# Patient Record
Sex: Male | Born: 1950 | Race: White | Hispanic: No | Marital: Married | Smoking: Never smoker
Health system: Southern US, Community
[De-identification: ages and names within clinical notes are randomized; demographics above are authoritative.]

## PROBLEM LIST (undated history)

## (undated) DIAGNOSIS — N189 Chronic kidney disease, unspecified: Secondary | ICD-10-CM

## (undated) DIAGNOSIS — I350 Nonrheumatic aortic (valve) stenosis: Secondary | ICD-10-CM

## (undated) DIAGNOSIS — IMO0001 Reserved for inherently not codable concepts without codable children: Secondary | ICD-10-CM

## (undated) DIAGNOSIS — I77810 Thoracic aortic ectasia: Secondary | ICD-10-CM

## (undated) DIAGNOSIS — Z8249 Family history of ischemic heart disease and other diseases of the circulatory system: Secondary | ICD-10-CM

## (undated) DIAGNOSIS — I10 Essential (primary) hypertension: Secondary | ICD-10-CM

## (undated) DIAGNOSIS — E785 Hyperlipidemia, unspecified: Secondary | ICD-10-CM

## (undated) DIAGNOSIS — I6521 Occlusion and stenosis of right carotid artery: Secondary | ICD-10-CM

## (undated) DIAGNOSIS — I129 Hypertensive chronic kidney disease with stage 1 through stage 4 chronic kidney disease, or unspecified chronic kidney disease: Secondary | ICD-10-CM

## (undated) DIAGNOSIS — E1122 Type 2 diabetes mellitus with diabetic chronic kidney disease: Secondary | ICD-10-CM

## (undated) HISTORY — DX: Chronic kidney disease, unspecified: N18.9

## (undated) HISTORY — DX: Nonrheumatic aortic (valve) stenosis: I35.0

## (undated) HISTORY — DX: Hypertensive chronic kidney disease with stage 1 through stage 4 chronic kidney disease, or unspecified chronic kidney disease: I12.9

## (undated) HISTORY — DX: Type 2 diabetes mellitus with diabetic chronic kidney disease: E11.22

## (undated) HISTORY — DX: Thoracic aortic ectasia: I77.810

## (undated) HISTORY — DX: Reserved for inherently not codable concepts without codable children: IMO0001

## (undated) HISTORY — DX: Occlusion and stenosis of right carotid artery: I65.21

## (undated) HISTORY — DX: Essential (primary) hypertension: I10

## (undated) HISTORY — DX: Hyperlipidemia, unspecified: E78.5

## (undated) HISTORY — DX: Family history of ischemic heart disease and other diseases of the circulatory system: Z82.49

---

## 2005-10-22 IMAGING — CT AWWO/PW
3 of 7 series · 13 of 42 positions shown, 19 images · IV contrast (CONTRAST)
Comparison: none

[Series 3: arterial · axial · arterial · 0.91mm/px · z∈[+1230,+1380]mm · 3 of 60 slices shown]
[im 15/60  soft-tissue]
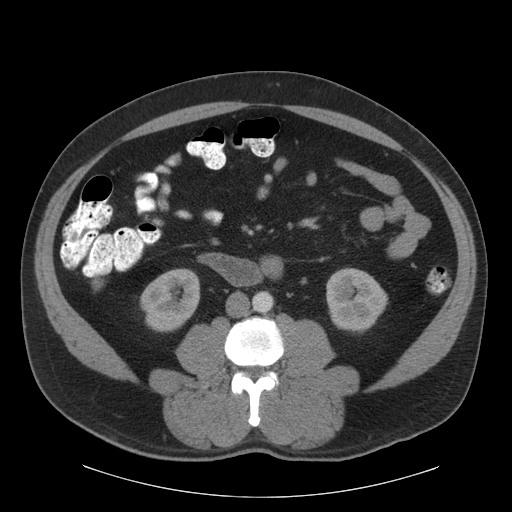
[im 30/60  soft-tissue]
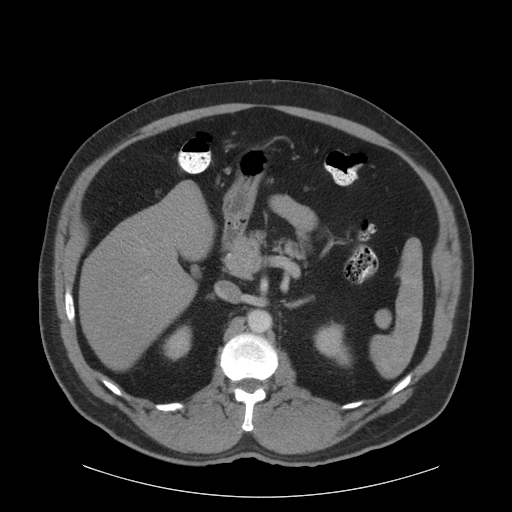
[im 45/60  soft-tissue]
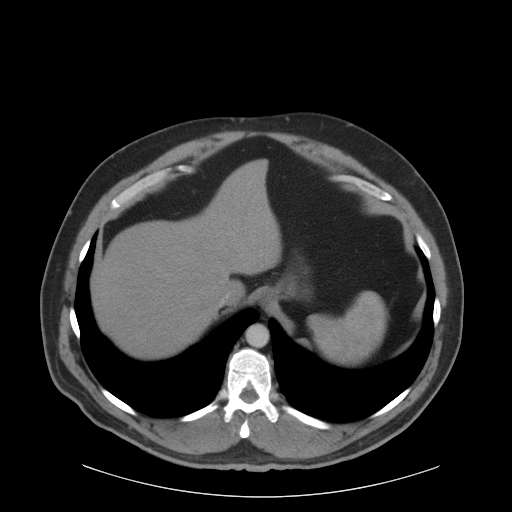

[Series 4: venous · axial · portal-venous · 0.91mm/px · z∈[+946,+1381]mm · 7 of 117 slices shown, 12 images]
[im 15/117  soft-tissue]
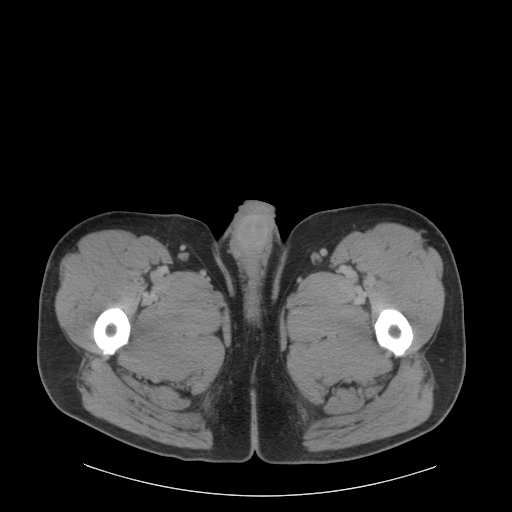
[im 15/117  bone]
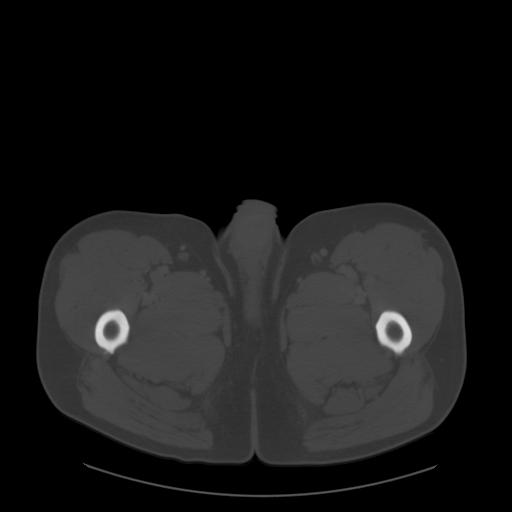
[im 30/117  soft-tissue]
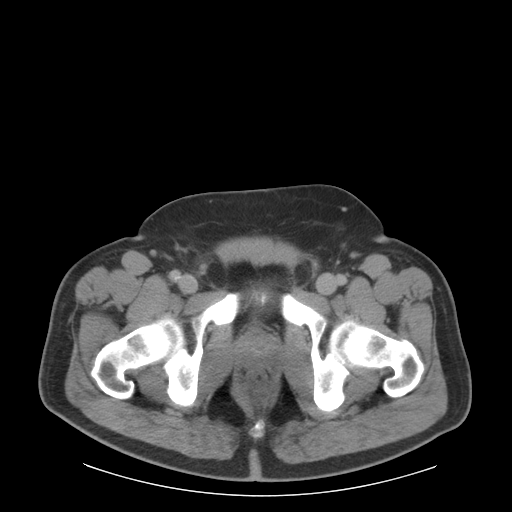
[im 44/117  soft-tissue]
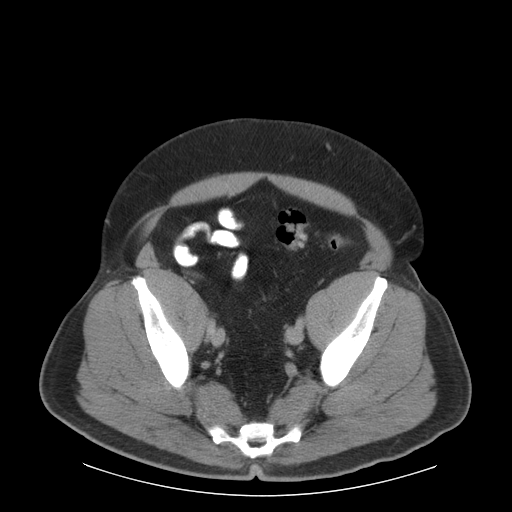
[im 59/117  soft-tissue]
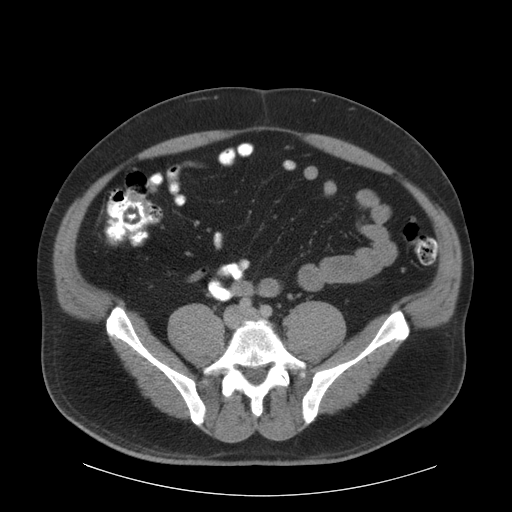
[im 59/117  lung]
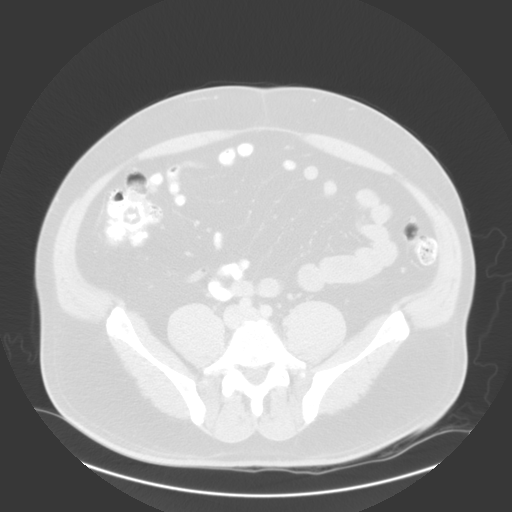
[im 73/117  soft-tissue]
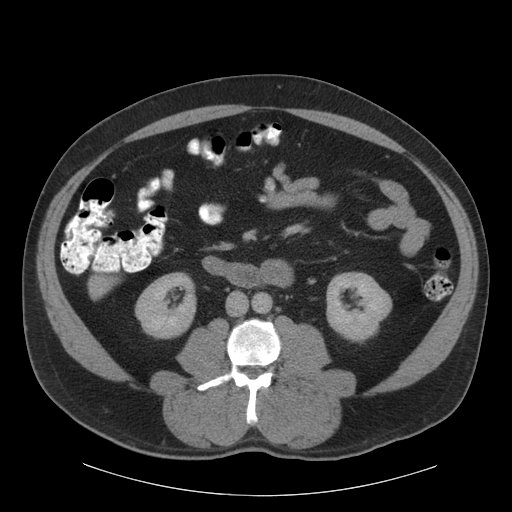
[im 73/117  lung]
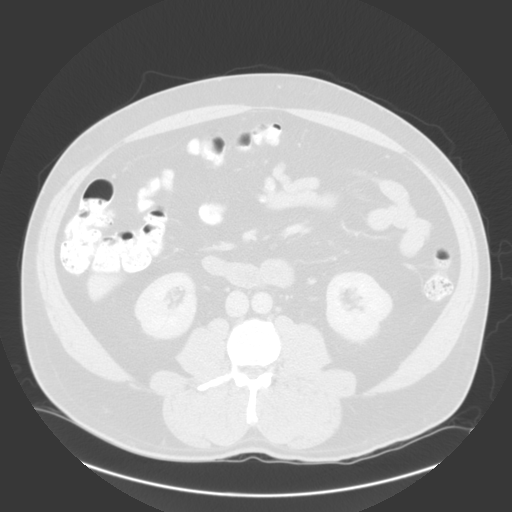
[im 88/117  soft-tissue]
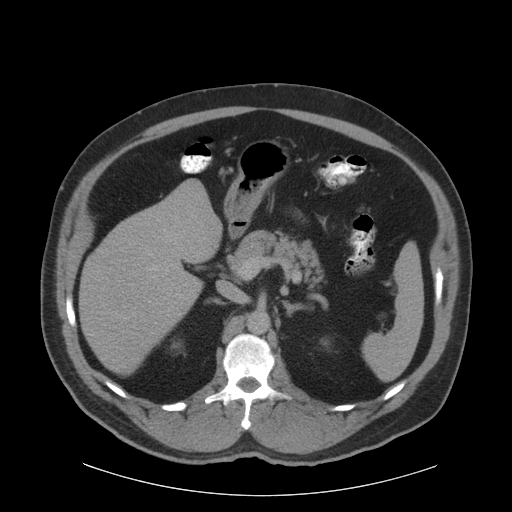
[im 88/117  lung]
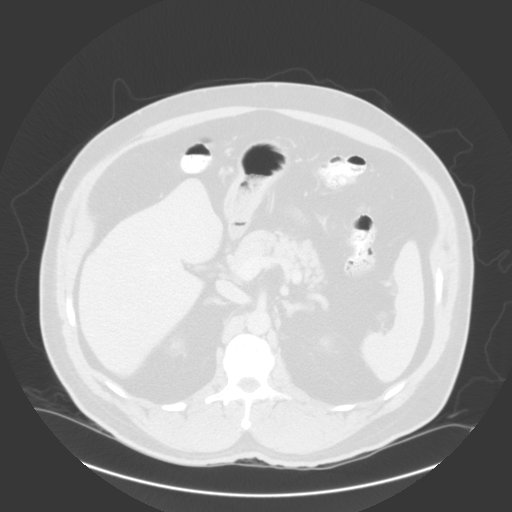
[im 102/117  soft-tissue]
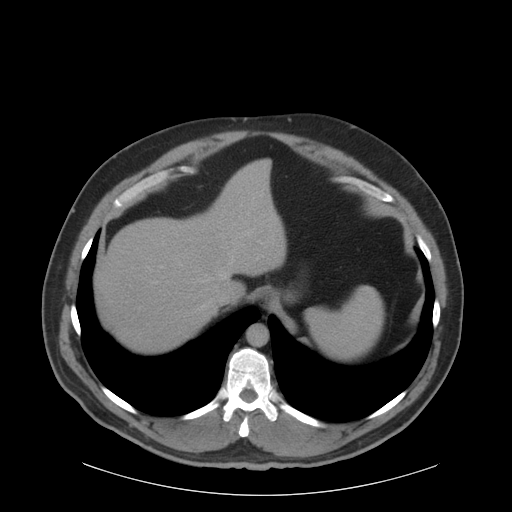
[im 102/117  lung]
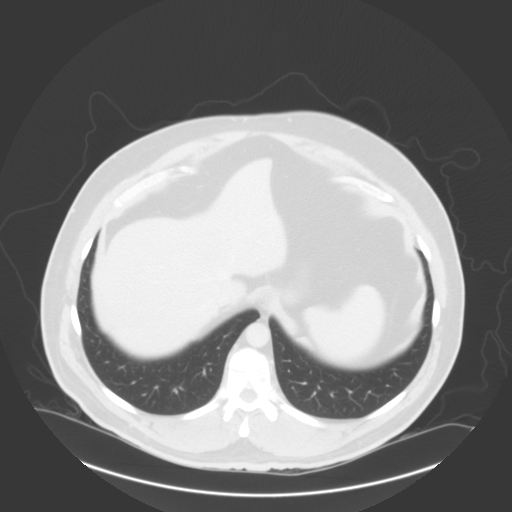

[coronal · coronal · 1.13mm/px · 3 of 114 slices shown, 4 images]
[im 38/114  soft-tissue]
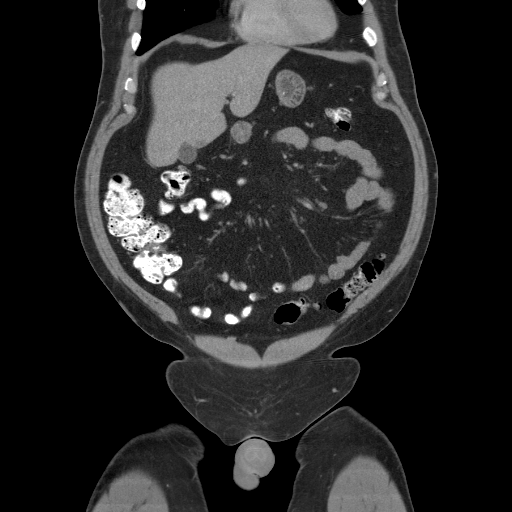
[im 51/114  soft-tissue]
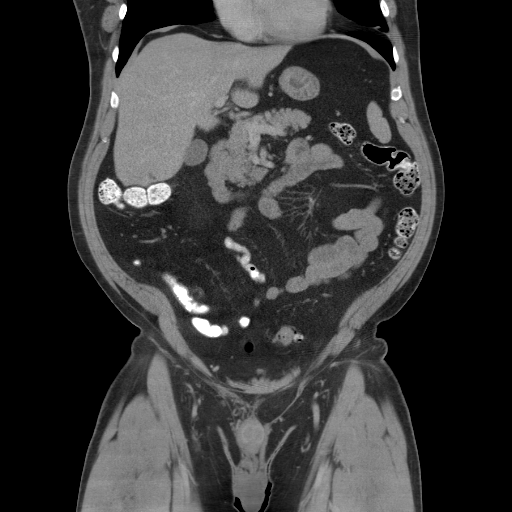
[im 51/114  bone]
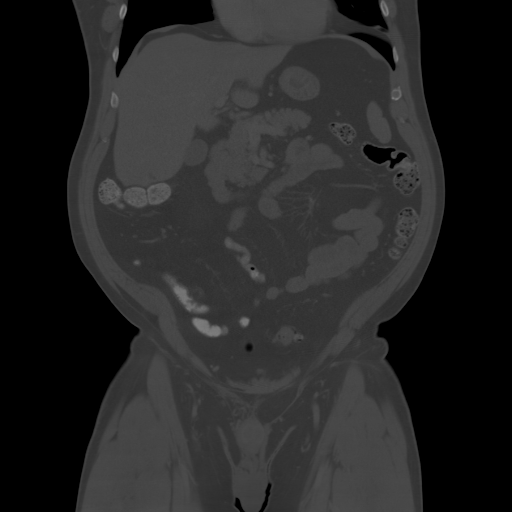
[im 63/114  soft-tissue]
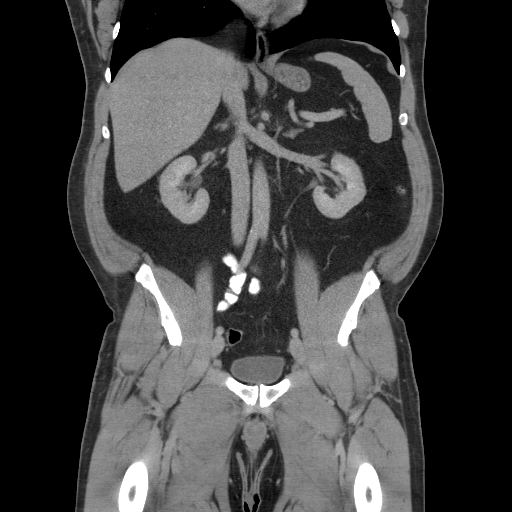

[13 of 42 positions shown; findings below may reference images not displayed]

______________________________________________________________
JIM [DATE] JIM [DATE]

REASON FOR CONSULTATION: Last Menstrual Date:..... 2 WKS AGO Reason
for Consultation:. R/O FRACTURE Comment to Radiology:.... RM GYN 1 LEFT
GREAT TOE
____________________________________________

EXAM: TOES

Three views with a repeat three views for technique shows no fracture or
dislocation.

 Patient JIM [DATE]

## 2011-12-21 ENCOUNTER — Ambulatory Visit (INDEPENDENT_AMBULATORY_CARE_PROVIDER_SITE_OTHER): Payer: BC Managed Care – PPO | Admitting: Family Medicine

## 2011-12-21 VITALS — BP 165/90 | HR 65 | Temp 98.2°F | Resp 16 | Ht 71.0 in | Wt 251.0 lb

## 2011-12-21 DIAGNOSIS — J029 Acute pharyngitis, unspecified: Secondary | ICD-10-CM

## 2011-12-21 LAB — POCT RAPID STREP A (OFFICE): Rapid Strep A Screen: NEGATIVE

## 2011-12-21 MED ORDER — AZITHROMYCIN 250 MG PO TABS: ORAL_TABLET | ORAL | Status: AC

## 2011-12-21 NOTE — Progress Notes (Signed)
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  Noted exudates with green phlegm when clearing throat today.  O:  NAD  130/80 Oroph:  Red, no exudates Neck: mild anterior cervical node enlgment bilaterally TM: normal Chest:  Clear Heart:  Reg, no murmur  A:

## 2011-12-21 NOTE — Patient Instructions (Addendum)
Blood pressure:  130/80   Sore Throat Sore throats may be caused by bacteria and viruses. They may also be caused by:  Smoking.   Pollution.   Allergies.  If a sore throat is due to strep infection (a bacterial infection), you may need:  A throat swab.   A culture test to verify the strep infection.  You will need one of these:  An antibiotic shot.   Oral medicine for a full 10 days.  Strep infection is very contagious. A doctor should check any close contacts who have a sore throat or fever. A sore throat caused by a virus infection will usually last only 3-4 days. Antibiotics will not treat a viral sore throat.  Infectious mononucleosis (a viral disease), however, can cause a sore throat that lasts for up to 3 weeks. Mononucleosis can be diagnosed with blood tests. You must have been sick for at least 1 week in order for the test to give accurate results. HOME CARE INSTRUCTIONS   To treat a sore throat, take mild pain medicine.   Increase your fluids.   Eat a soft diet.   Do not smoke.   Gargling with warm water or salt water (1 tsp. salt in 8 oz. water) can be helpful.   Try throat sprays or lozenges or sucking on hard candy to ease the symptoms.  Call your doctor if your sore throat lasts longer than 1 week.  SEEK IMMEDIATE MEDICAL CARE IF:  You have difficulty breathing.   You have increased swelling in the throat.   You have pain so severe that you are unable to swallow fluids or your saliva.   You have a severe headache, a high fever, vomiting, or a red rash.  Document Released: 09/16/2004 Document Revised: 07/29/2011 Document Reviewed: 07/27/2007 [facility] Patient Information 2012 [place], [place].

## 2012-08-05 ENCOUNTER — Ambulatory Visit (INDEPENDENT_AMBULATORY_CARE_PROVIDER_SITE_OTHER): Payer: BC Managed Care – PPO | Admitting: Emergency Medicine

## 2012-08-05 VITALS — BP 150/84 | HR 87 | Temp 98.6°F | Resp 18 | Wt 259.0 lb

## 2012-08-05 DIAGNOSIS — H8110 Benign paroxysmal vertigo, unspecified ear: Secondary | ICD-10-CM

## 2012-08-05 MED ORDER — MECLIZINE HCL 50 MG PO TABS: 50.0000 mg | ORAL_TABLET | Freq: Three times a day (TID) | ORAL | Status: DC | PRN

## 2012-08-05 NOTE — Patient Instructions (Addendum)
Benign Positional Vertigo  Vertigo means you feel like you or your surroundings are moving when they are not. Benign positional vertigo is the most common form of vertigo. Benign means that the cause of your condition is not serious. Benign positional vertigo is more common in older adults.  CAUSES   Benign positional vertigo is the result of an upset in the labyrinth system. This is an area in the middle ear that helps control your balance. This may be caused by a viral infection, head injury, or repetitive motion. However, often no specific cause is found.  SYMPTOMS   Symptoms of benign positional vertigo occur when you move your head or eyes in different directions. Some of the symptoms may include:  · Loss of balance and falls.  · Vomiting.  · Blurred vision.  · Dizziness.  · Nausea.  · Involuntary eye movements (nystagmus).  DIAGNOSIS   Benign positional vertigo is usually diagnosed by physical exam. If the specific cause of your benign positional vertigo is unknown, your caregiver may perform imaging tests, such as magnetic resonance imaging (MRI) or computed tomography (CT).  TREATMENT   Your caregiver may recommend movements or procedures to correct the benign positional vertigo. Medicines such as meclizine, benzodiazepines, and medicines for nausea may be used to treat your symptoms. In rare cases, if your symptoms are caused by certain conditions that affect the inner ear, you may need surgery.  HOME CARE INSTRUCTIONS   · Follow your caregiver's instructions.  · Move slowly. Do not make sudden body or head movements.  · Avoid driving.  · Avoid operating heavy machinery.  · Avoid performing any tasks that would be dangerous to you or others during a vertigo episode.  · Drink enough fluids to keep your urine clear or pale yellow.  SEEK IMMEDIATE MEDICAL CARE IF:   · You develop problems with walking, weakness, numbness, or using your arms, hands, or legs.  · You have difficulty speaking.  · You develop  severe headaches.  · Your nausea or vomiting continues or gets worse.  · You develop visual changes.  · Your family or friends notice any behavioral changes.  · Your condition gets worse.  · You have a fever.  · You develop a stiff neck or sensitivity to light.  MAKE SURE YOU:   · Understand these instructions.  · Will watch your condition.  · Will get help right away if you are not doing well or get worse.  Document Released: 05/17/2006 Document Revised: 11/01/2011 Document Reviewed: 04/29/2011  ExitCare® Patient Information ©2013 [employer].

## 2012-08-05 NOTE — Progress Notes (Signed)
[facility] [address] [place] [address] [phone]  Date:  08/05/2012   Name:  Russell Vincent   DOB:  [birth year]   MRN:  [id number]  PCP:  No primary provider on file.    Chief Complaint: Dizziness   History of Present Illness:  Russell Vincent is a 61 y.o. very pleasant male patient who presents with the following:  Friday morning noticed that he was dizzy on arising.  Says the dizziness was apparent when he changed the axis of his head.  Resolved in the morning.  Then on arising developed vertigo that has persisted today.  Says feels fine while sitting still or walking at a steady rate but presents when looking down or turning head side to side.  Denies antecedent illness or injury, no headache or other neuro or visual symptoms.  Denies nausea or vomiting.  No rash or stool changes.  There is no problem list on file for this patient.   Past Medical History  Diagnosis Date  . Chronic kidney disease     No past surgical history on file.  History  Substance Use Topics  . Smoking status: Never Smoker   . Smokeless tobacco: Not on file  . Alcohol Use: Yes     Comment: 2 beers a month    Family History  Problem Relation Age of Onset  . Heart disease Mother   . Heart disease Father     No Known Allergies  Medication list has been reviewed and updated.  Current Outpatient Prescriptions on File Prior to Visit  Medication Sig Dispense Refill  . fish oil-omega-3 fatty acids 1000 MG capsule Take 1 g by mouth daily.      . Multiple Vitamin (MULTIVITAMIN) tablet Take 1 tablet by mouth daily.        Review of Systems:  As per HPI, otherwise negative.    Physical Examination: Filed Vitals:   08/05/12 1448  BP: 150/84  Pulse: 87  Temp: 98.6 F (37 C)  Resp: 18   Filed Vitals:   08/05/12 1448  Weight: 259 lb (117.482 kg)   There is no height on file to calculate BMI. Ideal Body Weight:    GEN: WDWN, NAD, Non-toxic, A & O x 3   No rash or sepsis or shortness of breath HEENT: Atraumatic, Normocephalic. Neck supple. No masses, No LAD.  Oropharynx negative.  PRRERLA EOMI CN 2-12 intact Ears and Nose: No external deformity.  TM negative CV: RRR, No M/G/R. No JVD. No thrill. No extra heart sounds. PULM: CTA B, no wheezes, crackles, rhonchi. No retractions. No resp. distress. No accessory muscle use. ABD: S, NT, ND, +BS. No rebound. No HSM. EXTR: No c/c/e NEURO Normal gait.  PSYCH: Normally interactive. Conversant. Not depressed or anxious appearing.  Calm demeanor.    Assessment and Plan: Benign positional vertigo antivert Follow up if not improved  [name], MD

## 2014-03-12 ENCOUNTER — Ambulatory Visit (INDEPENDENT_AMBULATORY_CARE_PROVIDER_SITE_OTHER): Payer: BC Managed Care – PPO | Admitting: Family Medicine

## 2014-03-12 VITALS — BP 138/76 | HR 67 | Temp 98.3°F | Resp 18 | Ht 70.5 in | Wt 257.0 lb

## 2014-03-12 DIAGNOSIS — B029 Zoster without complications: Secondary | ICD-10-CM

## 2014-03-12 MED ORDER — VALACYCLOVIR HCL 1 G PO TABS: 1000.0000 mg | ORAL_TABLET | Freq: Three times a day (TID) | ORAL | Status: DC

## 2014-03-12 NOTE — Progress Notes (Signed)
This is a 63 year old gentleman who developed soreness, rash on the right parietal occipital region of his scalp to arriving here. He's developed some intermittent burning sharp pains on the top of his head the last 24 hours.  Patient's had no recent trauma, no fever, no loss of hearing or dizziness or tinnitus  Objective: Patient has a papulovesicular rash in the parieto-occipital area of his right scalp with a few papules down to the hairline behind the right ear. TM is normal Patient's neck is supple no adenopathy  Assessment: Shingles  Shingles - Plan: valACYclovir (VALTREX) 1000 MG tablet  Signed, Elvina SidleKurt Melisia Leming, MD

## 2014-03-12 NOTE — Patient Instructions (Signed)
Shingles °Shingles (herpes zoster) is an infection that is caused by the same virus that causes chickenpox (varicella). The infection causes a painful skin rash and fluid-filled blisters, which eventually break open, crust over, and heal. It may occur in any area of the body, but it usually affects only one side of the body or face. The pain of shingles usually lasts about 1 month. However, some people with shingles may develop long-term (chronic) pain in the affected area of the body. °Shingles often occurs many years after the person had chickenpox. It is more common: °· In people older than 50 years. °· In people with weakened immune systems, such as those with HIV, AIDS, or cancer. °· In people taking medicines that weaken the immune system, such as transplant medicines. °· In people under great stress. °CAUSES  °Shingles is caused by the varicella zoster virus (VZV), which also causes chickenpox. After a person is infected with the virus, it can remain in the person's body for years in an inactive state (dormant). To cause shingles, the virus reactivates and breaks out as an infection in a nerve root. °The virus can be spread from person to person (contagious) through contact with open blisters of the shingles rash. It will only spread to people who have not had chickenpox. When these people are exposed to the virus, they may develop chickenpox. They will not develop shingles. Once the blisters scab over, the person is no longer contagious and cannot spread the virus to others. °SYMPTOMS  °Shingles shows up in stages. The initial symptoms may be pain, itching, and tingling in an area of the skin. This pain is usually described as burning, stabbing, or throbbing. In a few days or weeks, a painful red rash will appear in the area where the pain, itching, and tingling were felt. The rash is usually on one side of the body in a band or belt-like pattern. Then, the rash usually turns into fluid-filled blisters. They  will scab over and dry up in approximately 2-3 weeks. °Flu-like symptoms may also occur with the initial symptoms, the rash, or the blisters. These may include: °· Fever. °· Chills. °· Headache. °· Upset stomach. °DIAGNOSIS  °Your caregiver will perform a skin exam to diagnose shingles. Skin scrapings or fluid samples may also be taken from the blisters. This sample will be examined under a microscope or sent to a lab for further testing. °TREATMENT  °There is no specific cure for shingles. Your caregiver will likely prescribe medicines to help you manage the pain, recover faster, and avoid long-term problems. This may include antiviral drugs, anti-inflammatory drugs, and pain medicines. °HOME CARE INSTRUCTIONS  °· Take a cool bath or apply cool compresses to the area of the rash or blisters as directed. This may help with the pain and itching.   °· Only take over-the-counter or prescription medicines as directed by your caregiver.   °· Rest as directed by your caregiver. °· Keep your rash and blisters clean with mild soap and cool water or as directed by your caregiver.  °· Do not pick your blisters or scratch your rash. Apply an anti-itch cream or numbing creams to the affected area as directed by your caregiver. °· Keep your shingles rash covered with a loose bandage (dressing). °· Avoid skin contact with: °¨ Babies.   °¨ Pregnant women.   °¨ Children with eczema.   °¨ Elderly people with transplants.   °¨ People with chronic illnesses, such as leukemia or AIDS.   °· Wear loose-fitting clothing to help ease the   pain of material rubbing against the rash. °· Keep all follow-up appointments with your caregiver. If the area involved is on your face, you may receive a referral for follow-up to a specialist, such as an eye doctor (ophthalmologist) or an ear, nose, and throat (ENT) doctor. Keeping all follow-up appointments will help you avoid eye complications, chronic pain, or disability.   °SEEK IMMEDIATE MEDICAL  CARE IF:  °· You have facial pain, pain around the eye area, or loss of feeling on one side of your face. °· You have ear pain or ringing in your ear. °· You have loss of taste. °· Your pain is not relieved with prescribed medicines.   °· Your redness or swelling spreads.   °· You have more pain and swelling.  °· Your condition is worsening or has changed.   °· You have a fever or persistent symptoms for more than 2-3 days. °· You have a fever and your symptoms suddenly get worse. °MAKE SURE YOU: °· Understand these instructions. °· Will watch your condition. °· Will get help right away if you are not doing well or get worse. °Document Released: 08/09/2005 Document Revised: 05/03/2012 Document Reviewed: 03/23/2012 °ExitCare® Patient Information ©2015 [employer]. This information is not intended to replace advice given to you by your health care provider. Make sure you discuss any questions you have with your health care provider. ° °

## 2014-03-13 ENCOUNTER — Telehealth: Payer: Self-pay

## 2014-03-13 DIAGNOSIS — B029 Zoster without complications: Secondary | ICD-10-CM

## 2014-03-13 MED ORDER — GABAPENTIN 100 MG PO CAPS: 100.0000 mg | ORAL_CAPSULE | Freq: Three times a day (TID) | ORAL | Status: DC

## 2014-03-13 NOTE — Telephone Encounter (Signed)
Notified wife that Rx was sent.

## 2014-03-13 NOTE — Telephone Encounter (Signed)
CHERYL STATES HER HUSBAND WAS DIAGNOSED WITH SHINGLES AND WANTED TO KNOW IF THE DR COULD CALL IN SOME NERVE MEDICINE TO HELP EASE THE PAIN PLEASE CALL [phone]    [facility] [address]

## 2014-03-13 NOTE — Telephone Encounter (Signed)
Patient's wife called once again regarding a medication for her husband. Please return call asap. Thank you.

## 2014-03-21 ENCOUNTER — Telehealth: Payer: Self-pay

## 2014-03-21 NOTE — Telephone Encounter (Signed)
Pt called in and states he needs a different medication for nerve pain. He was seen here and got Gabapentin and says it is not helping. [facility] uses [facility] [address] he can be reached [phone]. Thank you

## 2014-03-22 MED ORDER — HYDROCODONE-ACETAMINOPHEN 5-325 MG PO TABS: 1.0000 | ORAL_TABLET | Freq: Three times a day (TID) | ORAL | Status: DC | PRN

## 2014-03-22 NOTE — Telephone Encounter (Signed)
Left message to return call. RX ready for p/u

## 2014-03-22 NOTE — Telephone Encounter (Signed)
Rx for [employer] printed

## 2014-03-22 NOTE — Telephone Encounter (Signed)
Patient's wife called to inquire about rx. Advised her that it was ready for pick-up at 102.

## 2016-05-25 ENCOUNTER — Encounter (INDEPENDENT_AMBULATORY_CARE_PROVIDER_SITE_OTHER): Payer: Self-pay | Admitting: Orthopaedic Surgery

## 2016-05-25 ENCOUNTER — Ambulatory Visit (INDEPENDENT_AMBULATORY_CARE_PROVIDER_SITE_OTHER): Payer: Commercial Managed Care - PPO | Admitting: Orthopaedic Surgery

## 2016-05-25 DIAGNOSIS — M65312 Trigger thumb, left thumb: Secondary | ICD-10-CM | POA: Diagnosis not present

## 2016-11-11 ENCOUNTER — Encounter (INDEPENDENT_AMBULATORY_CARE_PROVIDER_SITE_OTHER): Payer: Self-pay

## 2016-11-11 ENCOUNTER — Ambulatory Visit (INDEPENDENT_AMBULATORY_CARE_PROVIDER_SITE_OTHER): Payer: Commercial Managed Care - PPO | Admitting: Cardiology

## 2016-11-11 ENCOUNTER — Encounter: Payer: Self-pay | Admitting: Cardiology

## 2016-11-11 VITALS — BP 148/88 | HR 54 | Ht 70.5 in | Wt 260.8 lb

## 2016-11-11 DIAGNOSIS — E1169 Type 2 diabetes mellitus with other specified complication: Secondary | ICD-10-CM

## 2016-11-11 DIAGNOSIS — E782 Mixed hyperlipidemia: Secondary | ICD-10-CM

## 2016-11-11 DIAGNOSIS — I10 Essential (primary) hypertension: Secondary | ICD-10-CM

## 2016-11-11 DIAGNOSIS — Z8249 Family history of ischemic heart disease and other diseases of the circulatory system: Secondary | ICD-10-CM | POA: Insufficient documentation

## 2016-11-11 DIAGNOSIS — E1122 Type 2 diabetes mellitus with diabetic chronic kidney disease: Secondary | ICD-10-CM | POA: Diagnosis not present

## 2016-11-11 DIAGNOSIS — N182 Chronic kidney disease, stage 2 (mild): Secondary | ICD-10-CM | POA: Diagnosis not present

## 2016-11-11 DIAGNOSIS — E785 Hyperlipidemia, unspecified: Secondary | ICD-10-CM

## 2016-11-11 DIAGNOSIS — I129 Hypertensive chronic kidney disease with stage 1 through stage 4 chronic kidney disease, or unspecified chronic kidney disease: Secondary | ICD-10-CM

## 2016-11-11 HISTORY — DX: Hyperlipidemia, unspecified: E78.5

## 2016-11-11 HISTORY — DX: Family history of ischemic heart disease and other diseases of the circulatory system: Z82.49

## 2016-11-11 HISTORY — DX: Essential (primary) hypertension: I10

## 2016-11-11 MED ORDER — ATORVASTATIN CALCIUM 20 MG PO TABS: 20.0000 mg | ORAL_TABLET | Freq: Every day | ORAL | 11 refills | Status: DC

## 2016-11-11 MED ORDER — LISINOPRIL 10 MG PO TABS: 10.0000 mg | ORAL_TABLET | Freq: Every day | ORAL | 11 refills | Status: DC

## 2016-11-11 NOTE — Patient Instructions (Signed)
Medication Instructions:  1) START LIPITOR 20 mg daily 2) START LISINOPRIL 10 mg daily  Labwork: FASTING labs in 6 weeks: Lipids, liver  Testing/Procedures: Your physician has requested that you have an exercise tolerance test. For further information please visit [email or web address]. Please also follow instruction sheet, as given.   Dr. Mayford Knifeurner recommends you have a CALCIUM SCORE.  Follow-Up: Your physician recommends that you schedule a follow-up appointment in ONE WEEK in the HTN Clinic. You will have labs drawn this day as well.  Your physician wants you to follow-up in: 6 months with Dr. Mayford Knifeurner. You will receive a reminder letter in the mail two months in advance. If you don't receive a letter, please call our office to schedule the follow-up appointment.   Any Other Special Instructions Will Be Listed Below (If Applicable).     If you need a refill on your cardiac medications before your next appointment, please call your pharmacy.

## 2016-11-11 NOTE — Progress Notes (Signed)
Cardiology Office Note    Date:  11/11/2016   ID:  Russell Vincent, DOB June 07, 1951, MRN [id number]  PCP:  Rueben BashBreejante Williams, PA /Dr. Andrey CampanileWilson Cardiologist:  Armanda Magicraci Turner, MD   Chief Complaint  Patient presents with  . New Evaluation    family history of CAD, HTN, hyperlipidemia    History of Present Illness:  Russell Vincent is a 66 y.o. male with a history of HTN, type 2 DM , mixed hyperlipidemia with last LDL154 and family history of premature CAD who is referred by his PCP Dr. Andrey CampanileWilson.  His father died of an MI at 5558 and he had several uncles with heart disease.  They were heavy drinkers and smokers.  He is doing well. He denies any chest pain or pressure, SOB, DOE, PND, orthopnea, LE edema, dizziness, palpitations (except when eating salty food) or syncope.  He lost 14 lbs in the past 2 weeks with cutting out carbs. He rides on the elliptical 3 times weekly and plays golf.     Past Medical History:  Diagnosis Date  . Benign essential HTN 11/11/2016  . Chronic kidney disease   . Family history of premature CAD 11/11/2016  . Hyperlipidemia 11/11/2016  . 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 No pertinent surgical history.  Current Medications: Current Meds  Medication Sig  . Multiple Vitamin (MULTIVITAMIN) tablet Take 1 tablet by mouth daily.    Allergies:   Patient has no known allergies.   Social History   Social History  . Marital status: Married    Spouse name: N/A  . Number of children: N/A  . Years of education: N/A   Social History Main Topics  . Smoking status: Never Smoker  . Smokeless tobacco: Never Used  . Alcohol use Yes     Comment: 2 beers a month  . Drug use: No  . Sexual activity: Yes   Other Topics Concern  . None   Social History Narrative  . None     Family History:  The patient's family history includes Heart disease in his father and mother.   ROS:   Please see the history of present illness.    ROS All  other systems reviewed and are negative.  PAD Screen 11/11/2016  Previous PAD dx? No  Previous surgical procedure? No  Pain with walking? No  Feet/toe relief with dangling? No  Painful, non-healing ulcers? No  Extremities discolored? No       PHYSICAL EXAM:   VS:  BP (!) 148/88   Pulse (!) 54   Ht 5' 10.5" (1.791 m)   Wt 260 lb 12.8 oz (118.3 kg)   BMI 36.89 kg/m    GEN: Well nourished, well developed, in no acute distress  HEENT: normal  Neck: no JVD, carotid bruits, or masses Cardiac: RRR; no murmurs, rubs, or gallops,no edema.  Intact distal pulses bilaterally.  Respiratory:  clear to auscultation bilaterally, normal work of breathing GI: soft, nontender, nondistended, + BS MS: no deformity or atrophy  Skin: warm and dry, no rash Neuro:  Alert and Oriented x 3, Strength and sensation are intact Psych: euthymic mood, full affect  Wt Readings from Last 3 Encounters:  11/11/16 260 lb 12.8 oz (118.3 kg)  03/12/14 257 lb (116.6 kg)  08/05/12 259 lb (117.5 kg)      Studies/Labs Reviewed:   EKG:  EKG is  ordered today.  The ekg ordered today demonstrates Sinus bradycardia  at 54bpm with no ST changes  Recent Labs: No results found for requested labs within last 8760 hours.   Lipid Panel No results found for: CHOL, TRIG, HDL, CHOLHDL, VLDL, LDLCALC, LDLDIRECT  Additional studies/ records that were reviewed today include:  Office notes from PA    ASSESSMENT:    1. Family history of premature CAD   2. Benign essential HTN   3. Mixed diabetic hyperlipidemia associated with type 2 diabetes mellitus (HCC)   4. 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 Family history of premature CAD - he denies any chest pain or SOB or any other anginal symptoms.  He has multipld CRFs including hyperlipidemia with elevated LDL at 154, type 2 DM with HbA1C 7.6, HTN and family history of premature CAD.  He is a nonsmoker.   His EKG is nonischemic so I will set him up for an ETT and get a coronary calcium score to assess future risk.    2.   HTN - BP elevated  today.  His BP at home runs 140-150's/78-45mmhg.  He is currently on no medications. Given his DM, I am going to start Lisinopril 10mg  daily and get a BMET in 1 week as well as HTN clinic.    3,   Hyperlipidemia with DM - His LDL is elevated and with DM should be at least < 100 and preferably < 70.  He has wanted to try diet in the past but given his underlying DM and family history of CAD, I have recommended that he start on Lipitor 20mg  daily and repeat FLP and ALT in 6 weeks.   4.   Type 2 DM poorly controlled with last HbA1C 7.6.  This is followed by his PCP and further treatment will be left to Dr. Andrey Campanile.  The PA wanted him to try diet and exercise first and his HbA1C will be rechecked.   If his calcium CT score is moderate to high I would recommend a more aggressive approach to his DM with medication.   Medication Adjustments/Labs and Tests Ordered: Current medicines are reviewed at length with the patient today.  Concerns regarding medicines are outlined above.  Medication changes, Labs and Tests ordered today are listed in the Patient Instructions below.  There are no Patient Instructions on file for this visit.   Signed, Armanda Magic, MD  11/11/2016 2:56 PM    [facility] [address] [place], [place], [place]  [address] Phone: [phone]; Fax: [phone]

## 2016-11-15 IMAGING — US US CAROTID DUPLEX BILAT
1 series · 13 of 24 positions shown · non-contrast
Comparison: None.

CLINICAL DATA: 66-year-old male with a history of bruit.

Cardiovascular risk factors include hypertension, hyperlipidemia,
diabetes
EXAM:
BILATERAL CAROTID DUPLEX ULTRASOUND
TECHNIQUE: Gray scale imaging, color Doppler and duplex ultrasound were
performed of bilateral carotid and vertebral arteries in the neck.

[Series 1: us carotid duplex bilat · 0.07mm/px · 13 of 49 slices shown]
[im 1/49]
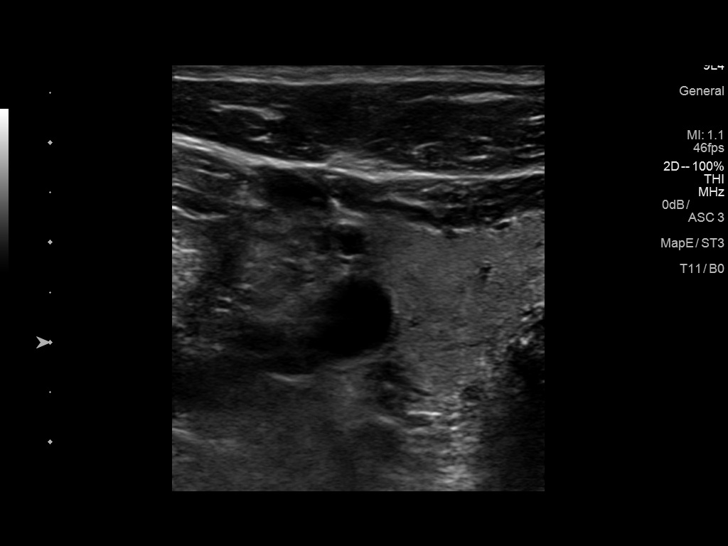
[im 5/49]
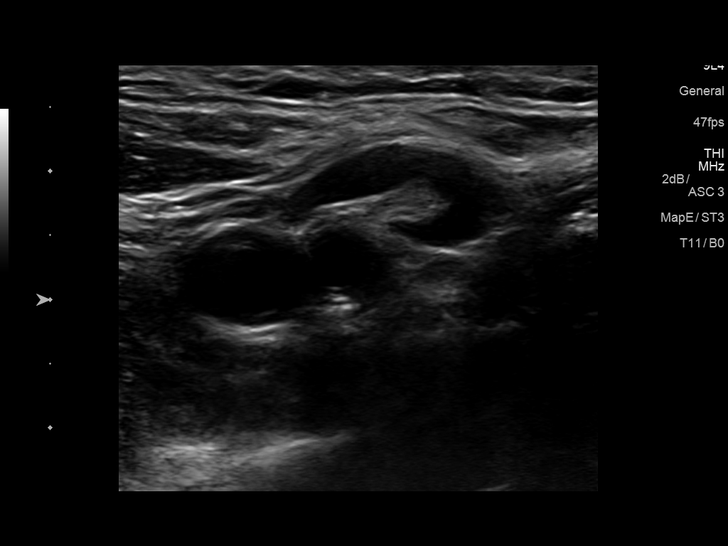
[im 9/49]
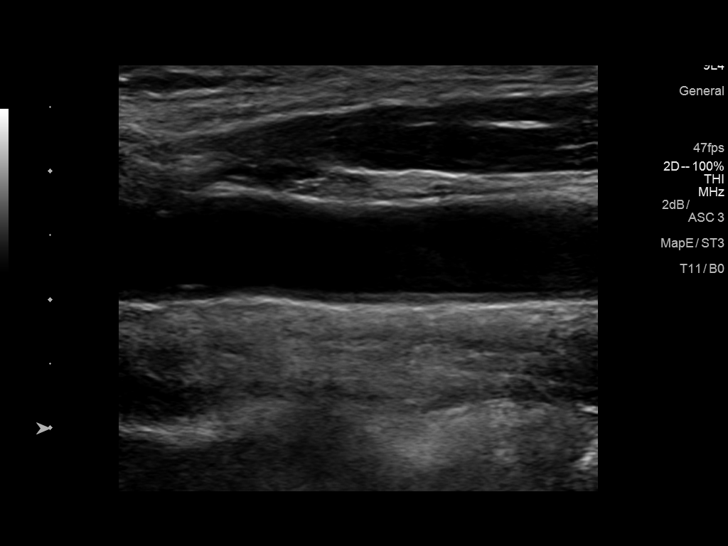
[im 13/49]
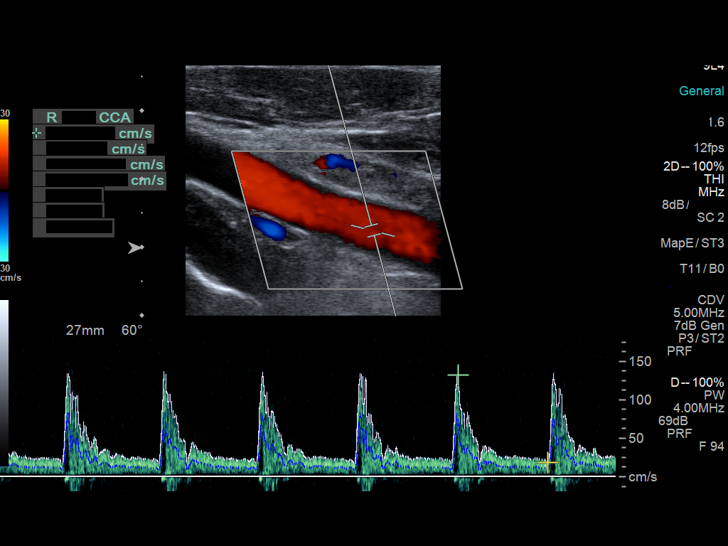
[im 17/49]
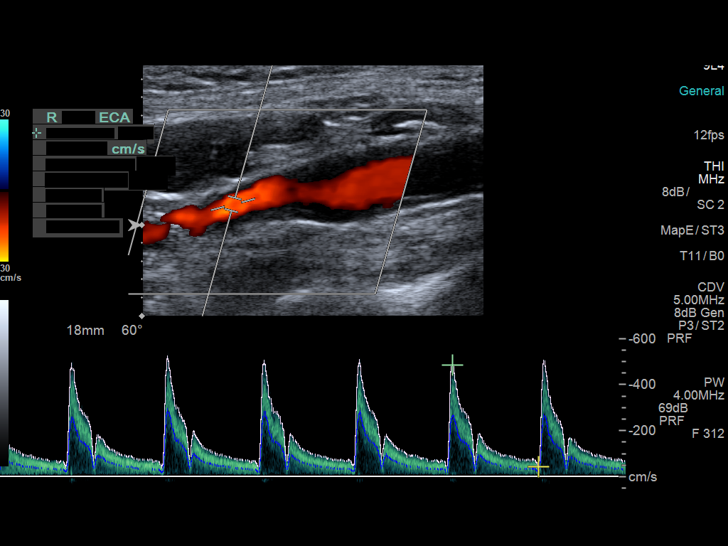
[im 21/49]
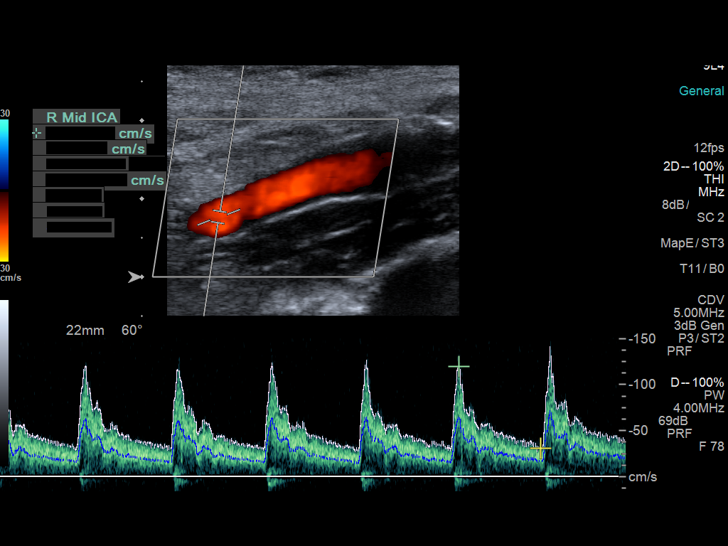
[im 26/49]
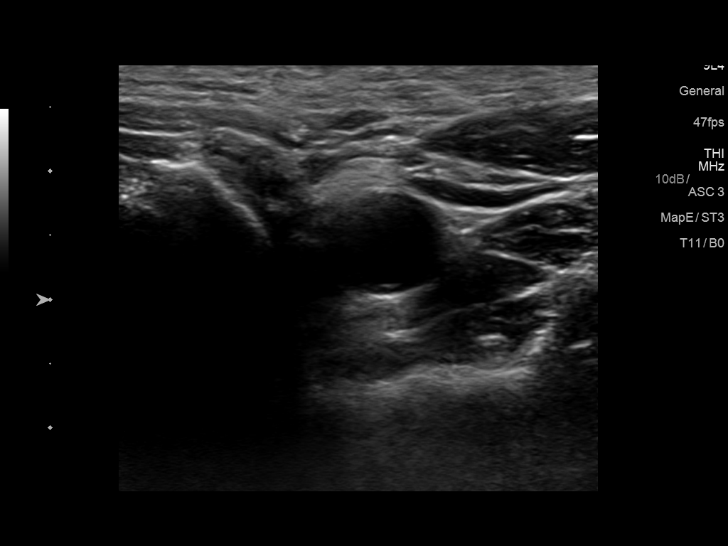
[im 28/49]
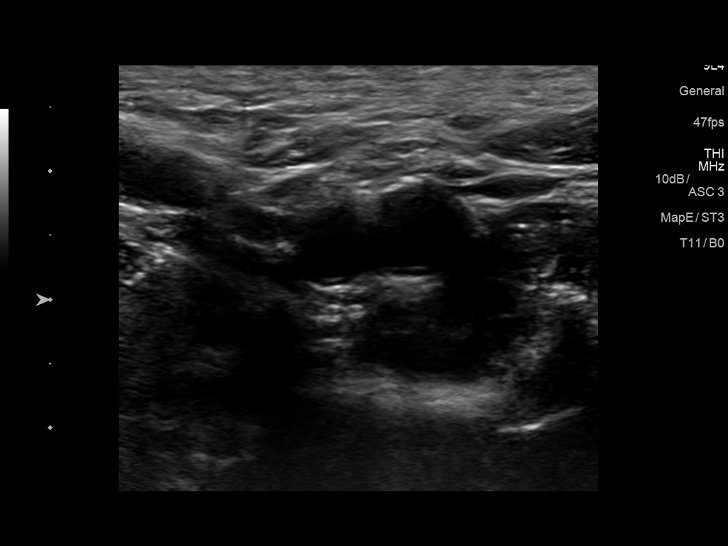
[im 32/49]
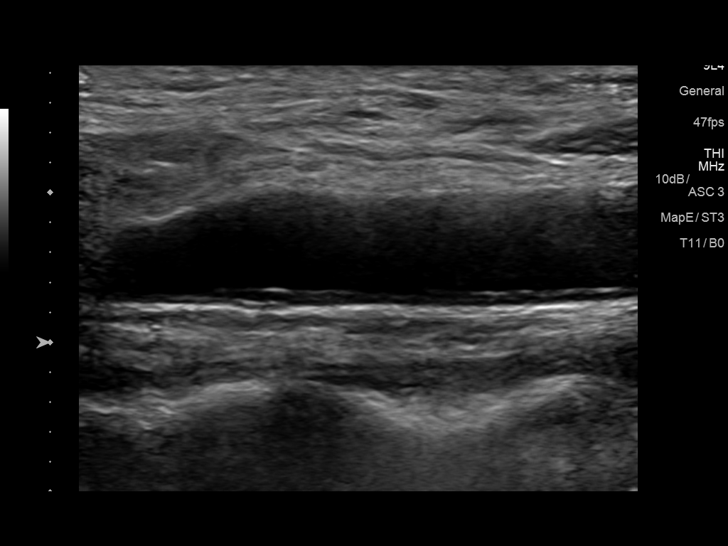
[im 36/49]
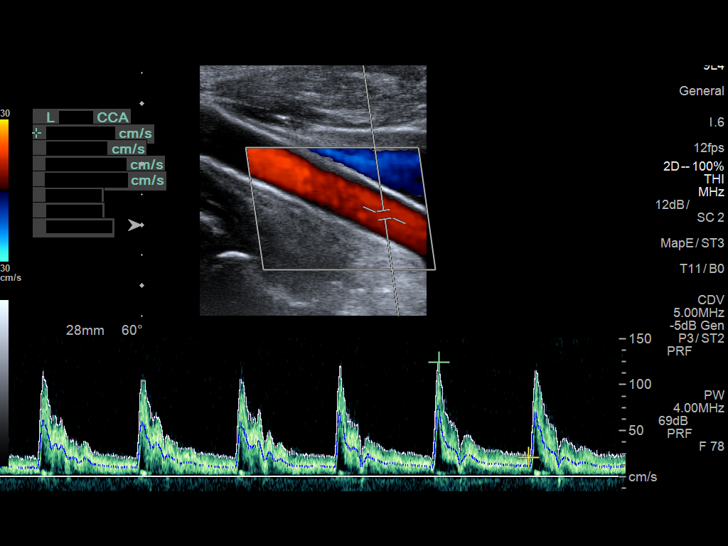
[im 40/49]
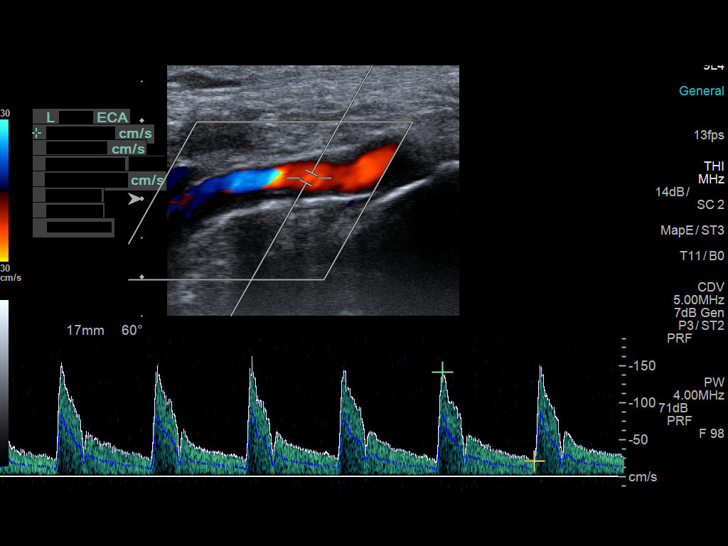
[im 44/49]
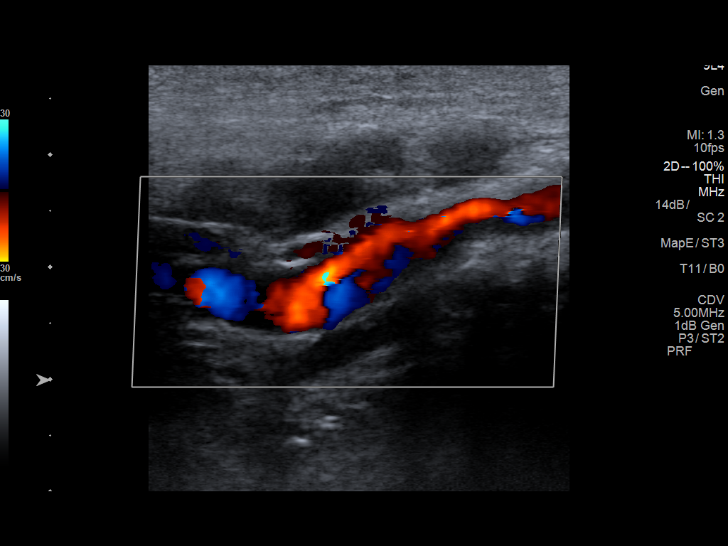
[im 49/49]
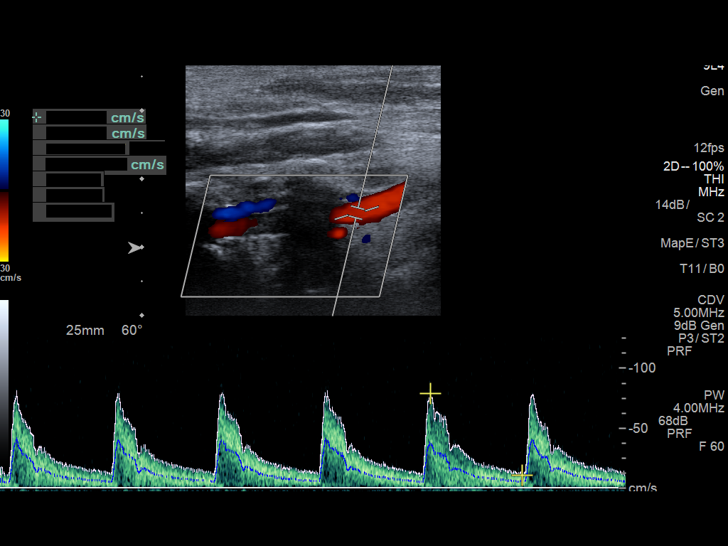

[13 of 24 positions shown; findings below may reference images not displayed]

FINDINGS: Criteria: Quantification of carotid stenosis is based on velocity
parameters that correlate the residual internal carotid diameter
with NASCET-based stenosis levels, using the diameter of the distal
internal carotid lumen as the denominator for stenosis measurement.

The following velocity measurements were obtained:

RIGHT

ICA:  Systolic 130 cm/sec, Diastolic 33 cm/sec

CCA:  139 cm/sec

SYSTOLIC ICA/CCA RATIO:

ECA:  484 cm/sec

LEFT

ICA:  Systolic 127 cm/sec, Diastolic 30 cm/sec

CCA:  136 cm/sec

SYSTOLIC ICA/CCA RATIO:

ECA:  378 cm/sec

Right Brachial SBP: 116

Left Brachial SBP: 117

RIGHT CAROTID ARTERY: No significant calcifications of the right
common carotid artery. Intermediate waveform maintained.
Heterogeneous and partially calcified plaque at the right carotid
bifurcation. No significant lumen shadowing. Low resistance waveform
of the right ICA. No significant tortuosity.

RIGHT VERTEBRAL ARTERY: Antegrade flow with low resistance waveform.

LEFT CAROTID ARTERY: No significant calcifications of the left
common carotid artery. Intermediate waveform maintained.
Heterogeneous and partially calcified plaque at the left carotid
bifurcation without significant lumen shadowing. Low resistance
waveform of the left ICA. No significant tortuosity.

LEFT VERTEBRAL ARTERY:  Antegrade flow with low resistance waveform.
IMPRESSION: Color duplex indicates minimal heterogeneous and calcified plaque,
with no hemodynamically significant stenosis by duplex criteria in
the extracranial cerebrovascular circulation.

## 2016-11-25 ENCOUNTER — Telehealth: Payer: Self-pay | Admitting: Cardiology

## 2016-11-25 NOTE — Telephone Encounter (Signed)
Informed patient he does not need to come to appointment fasting for lab work.  Instructed him to take his medications as instructed in the AM so he is treated for his BP check. He understands he will need to schedule fasting labs in about 1 month when he is in the office tomorrow. He was grateful for call.

## 2016-11-25 NOTE — Telephone Encounter (Signed)
New message   Pt is calling to find out if he should start fasting at midnight before his appt on Friday. And also if he should take his blood pressure medication tomorrow morning?

## 2016-11-26 ENCOUNTER — Ambulatory Visit (INDEPENDENT_AMBULATORY_CARE_PROVIDER_SITE_OTHER)
Admission: RE | Admit: 2016-11-26 | Discharge: 2016-11-26 | Disposition: A | Discharge status: Home / Self Care | Payer: Self-pay | Source: Ambulatory Visit | Attending: Cardiology | Admitting: Cardiology

## 2016-11-26 ENCOUNTER — Ambulatory Visit (INDEPENDENT_AMBULATORY_CARE_PROVIDER_SITE_OTHER): Payer: Commercial Managed Care - PPO | Admitting: Pharmacist

## 2016-11-26 ENCOUNTER — Other Ambulatory Visit: Payer: Commercial Managed Care - PPO | Admitting: *Deleted

## 2016-11-26 ENCOUNTER — Ambulatory Visit (INDEPENDENT_AMBULATORY_CARE_PROVIDER_SITE_OTHER): Payer: Commercial Managed Care - PPO

## 2016-11-26 VITALS — BP 118/66 | HR 61

## 2016-11-26 DIAGNOSIS — E1169 Type 2 diabetes mellitus with other specified complication: Secondary | ICD-10-CM | POA: Diagnosis not present

## 2016-11-26 DIAGNOSIS — E782 Mixed hyperlipidemia: Secondary | ICD-10-CM

## 2016-11-26 DIAGNOSIS — I129 Hypertensive chronic kidney disease with stage 1 through stage 4 chronic kidney disease, or unspecified chronic kidney disease: Secondary | ICD-10-CM

## 2016-11-26 DIAGNOSIS — Z8249 Family history of ischemic heart disease and other diseases of the circulatory system: Secondary | ICD-10-CM

## 2016-11-26 DIAGNOSIS — E1122 Type 2 diabetes mellitus with diabetic chronic kidney disease: Secondary | ICD-10-CM | POA: Diagnosis not present

## 2016-11-26 DIAGNOSIS — I10 Essential (primary) hypertension: Secondary | ICD-10-CM

## 2016-11-26 DIAGNOSIS — N182 Chronic kidney disease, stage 2 (mild): Secondary | ICD-10-CM | POA: Diagnosis not present

## 2016-11-26 LAB — EXERCISE TOLERANCE TEST
CHL CUP RESTING HR STRESS: 56 {beats}/min
CSEPED: 9 min
CSEPEDS: 0 s
CSEPEW: 10.1 METS
CSEPHR: 87 %
CSEPPHR: 136 {beats}/min
MPHR: 156 {beats}/min
RPE: 15

## 2016-11-26 LAB — BASIC METABOLIC PANEL
BUN / CREAT RATIO: 18 (ref 10–24)
BUN: 18 mg/dL (ref 8–27)
CO2: 21 mmol/L (ref 18–29)
CREATININE: 1 mg/dL (ref 0.76–1.27)
Calcium: 9.5 mg/dL (ref 8.6–10.2)
Chloride: 102 mmol/L (ref 96–106)
GFR, EST AFRICAN AMERICAN: 92 mL/min/{1.73_m2} (ref >59)
GFR, EST NON AFRICAN AMERICAN: 79 mL/min/{1.73_m2} (ref >59)
Glucose: 129 mg/dL — ABNORMAL HIGH (ref 65–99)
POTASSIUM: 4.6 mmol/L (ref 3.5–5.2)
SODIUM: 141 mmol/L (ref 134–144)

## 2016-11-26 IMAGING — CT CT HEART SCORING
2 series · 16 of 20 positions shown, 18 images · non-contrast
Comparison: None.

CLINICAL DATA: Risk stratification

EXAM:
Coronary Calcium Score
TECHNIQUE: The patient was scanned on a Siemens Somatom 64 slice scanner. Axial
non-contrast 3 mm slices were carried out through the heart. The
data set was analyzed on a dedicated work station and scored using
the Agatson method.

[Series 2: casc 3.0 i36f 2 bestdiast 64 % · axial · 0.36mm/px · z∈[-305,-191]mm · 8 of 50 slices shown, 10 images]
[im 6/50  vessel]
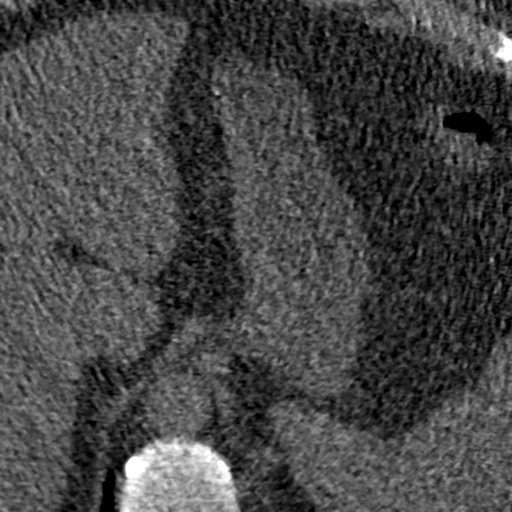
[im 6/50  lung]
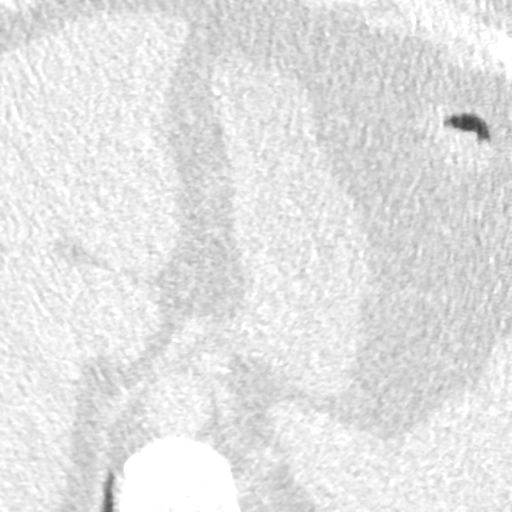
[im 11/50  vessel]
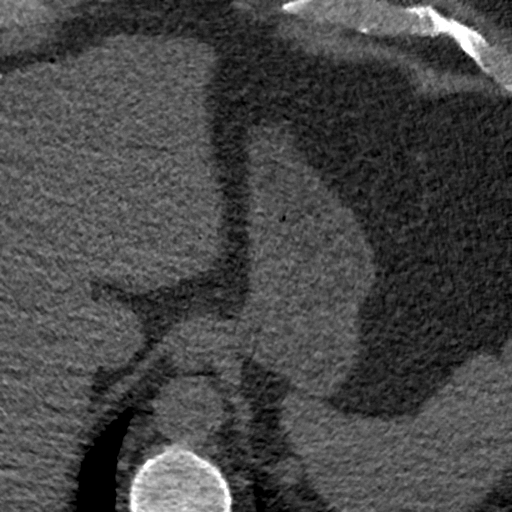
[im 17/50  vessel]
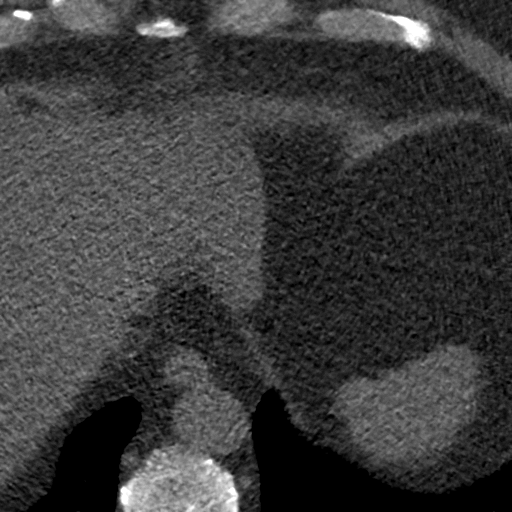
[im 22/50  vessel]
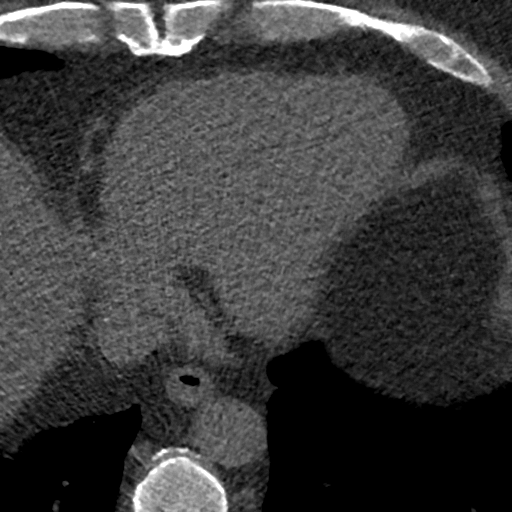
[im 28/50  vessel]
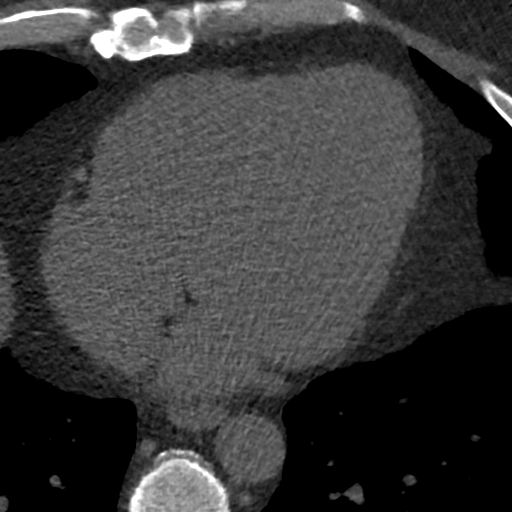
[im 28/50  lung]
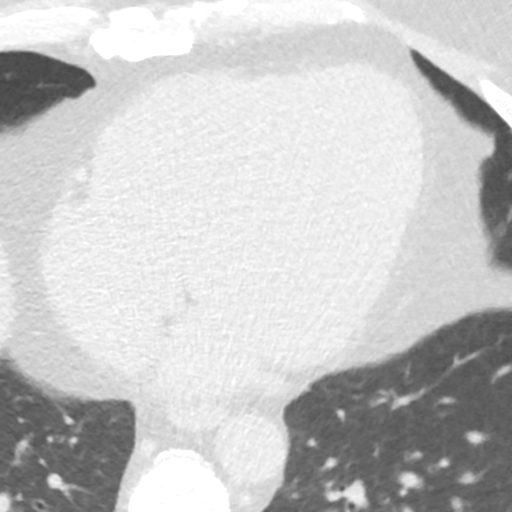
[im 33/50  vessel]
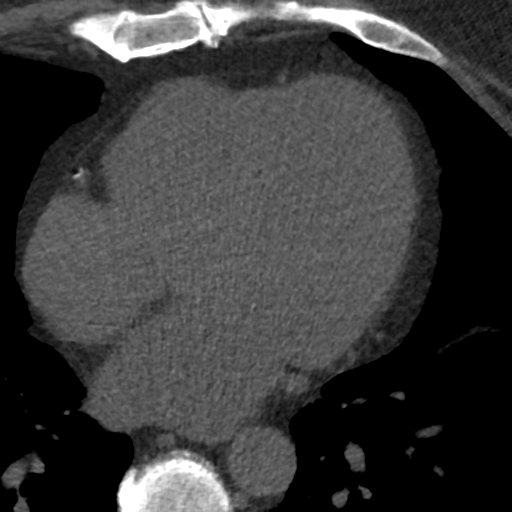
[im 39/50  vessel]
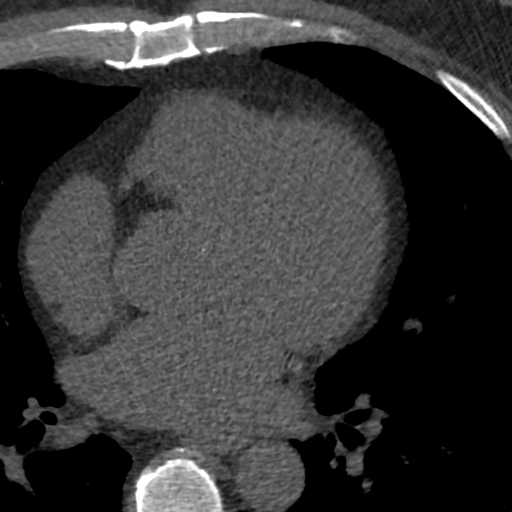
[im 44/50  vessel]
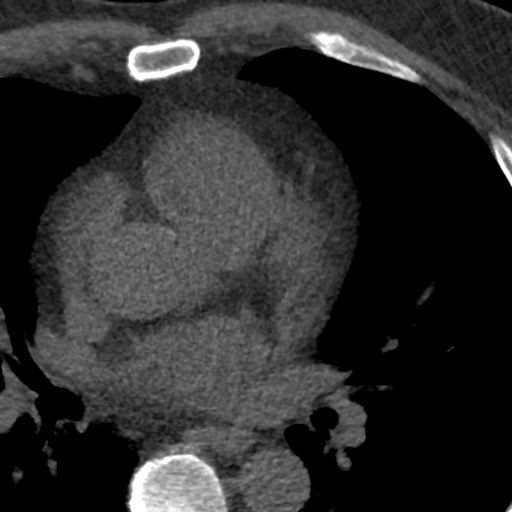

[Series 4: lung st 64 % · axial · 0.68mm/px · z∈[-305,-191]mm · 8 of 50 slices shown]
[im 6/50  lung]
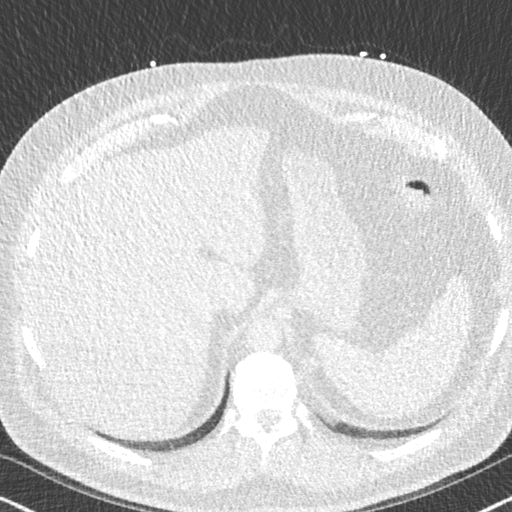
[im 11/50  lung]
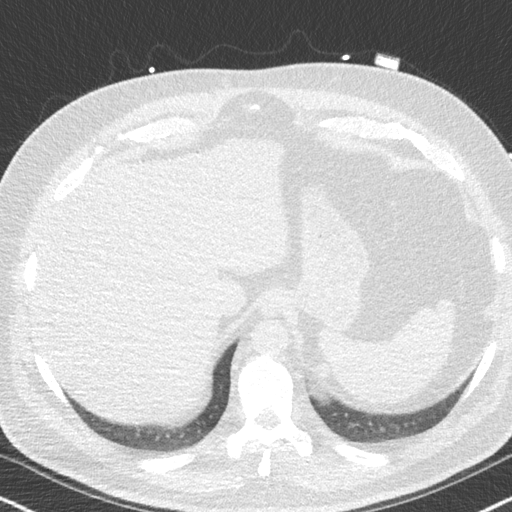
[im 17/50  lung]
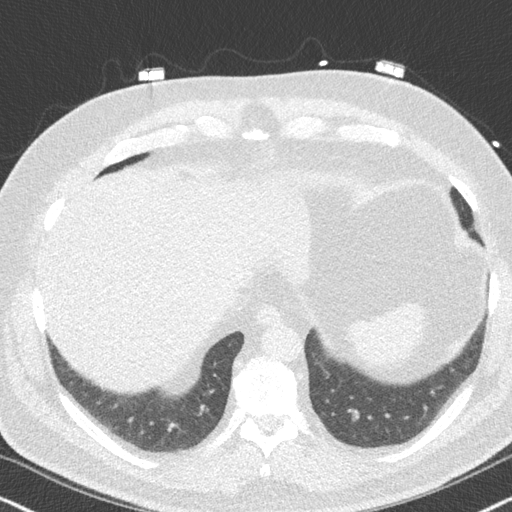
[im 22/50  lung]
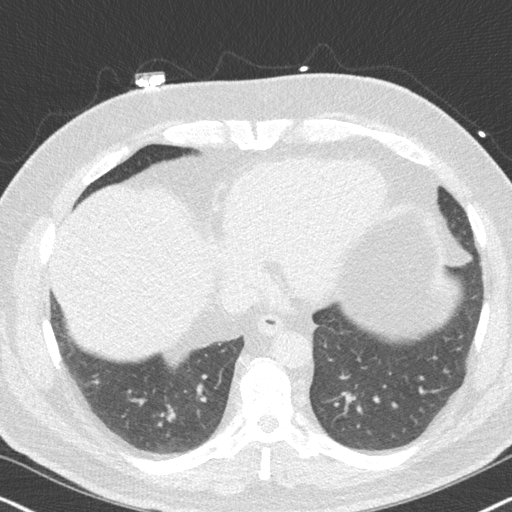
[im 28/50  lung]
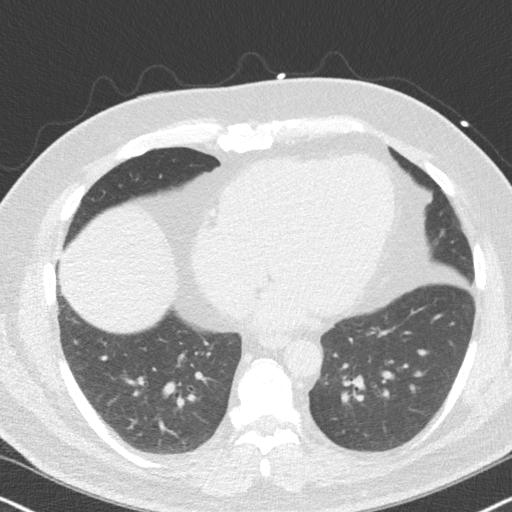
[im 33/50  lung]
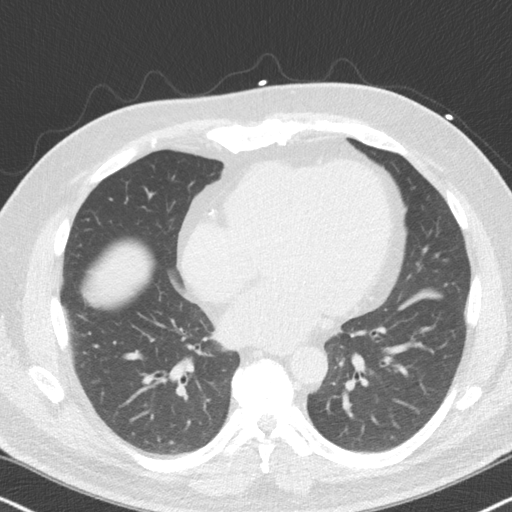
[im 39/50  lung]
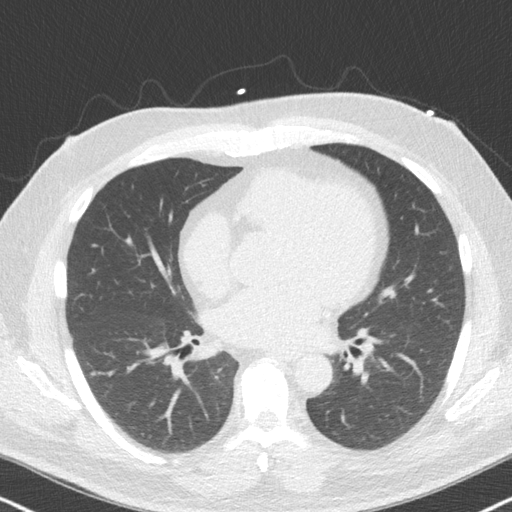
[im 44/50  lung]
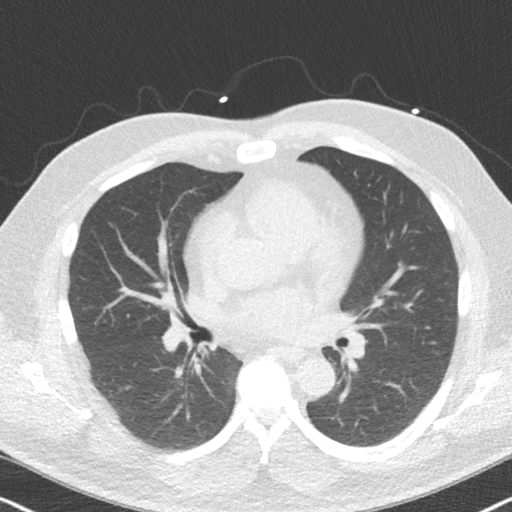

[16 of 20 positions shown; findings below may reference images not displayed]

FINDINGS: Non-cardiac: See separate report from [REDACTED].
Pulmonary nodules

Ascending Aorta:  40 mm

Pericardium: Normal

Coronary arteries: Calcium noted in all 3 epicardial coronary
arteries most heavy in the RCA
IMPRESSION: Coronary calcium score of 244. This was 73rd percentile for age and
sex matched control.

DENEK

EXAM:
OVER-READ INTERPRETATION  CT CHEST

The following report is an over-read performed by radiologist Dr.
does not include interpretation of cardiac or coronary anatomy or
pathology. The coronary calcium score interpretation by the
cardiologist is attached.
FINDINGS: Cardiovascular: Atherosclerotic nonaneurysmal thoracic aorta. Normal
caliber pulmonary arteries.

Mediastinum/Nodes: Unremarkable esophagus. No pathologically
enlarged axillary, mediastinal or gross hilar lymph nodes, noting
limited sensitivity for the detection of hilar adenopathy on this
noncontrast study.

Lungs/Pleura: No pneumothorax. No pleural effusion. No acute
consolidative airspace disease or lung masses. There are a few
scattered solid pulmonary nodules in the lungs bilaterally measuring
up to 4 mm in the right middle lobe (series 3/image 8).

Upper abdomen: Diffuse hepatic steatosis.

Musculoskeletal: No aggressive appearing focal osseous lesions. Mild
thoracic spondylosis.
IMPRESSION: 1. Scattered solid pulmonary nodules, largest 4 mm. No follow-up
needed if patient is low-risk (and has no known or suspected primary
neoplasm). Non-contrast chest CT can be considered in 12 months if
patient is high-risk. This recommendation follows the consensus
statement: Guidelines for Management of Incidental Pulmonary Nodules
Detected on CT Images: From the [HOSPITAL] [WX]; Radiology
2. Aortic atherosclerosis.
3. Diffuse hepatic steatosis.

## 2016-11-26 NOTE — Patient Instructions (Signed)
Continue taking your lisinopril, your blood pressure is excellent today.  Goal blood pressure is less than 130/80.  We will call you with lab results.

## 2016-11-26 NOTE — Progress Notes (Signed)
Patient ID: Russell Vincent                 DOB: 10/30/1950                      MRN: [id number]     HPI: Russell Vincent is a 66 y.o. male referred by Dr. Mayford Knife to HTN clinic. PMH is significant for HTN, DM2, HLD, and family history of premature CAD. BP was elevated to 148/88 at last visit 2 weeks ago and pt was started on lisinopril  daily d/t concomitant DM. He presents today for BP check and BMET.  Pt reports feeling well overall. Denies dizziness, blurred vision, or falls. He did have a headache for a few days after starting his lisinopril, but states that this has disappeared. He has checked his BP at home a few times since starting lisinopril and reports that his reading yesterday was 121/66. He has made many dietary changes since finding out that he has diabetes, and has lost 23 lbs so far. Has minimal caffeine and sodium in his diet.  Current HTN meds: lisinopril  daily BP goal: <130/19mmHg  Family History: The patient's family history includes Heart disease in his father and mother.   Social History: Denies tobacco, alcohol, and illicit drug use.  Diet: Low carb and sugar diet (has DM but no meds yet, has f/u appt with his PCP for A1c check soon), down 23 lbs so far. Drinking a lot of water. Cut out salt, drinks minimal caffeine - 1 cup of coffee a day.  Exercise: Rides the elliptical 3 days a week and plays golf. Walking a lot more as well.  Wt Readings from Last 3 Encounters:  11/11/16 260 lb 12.8 oz (118.3 kg)  03/12/14 257 lb (116.6 kg)  08/05/12 259 lb (117.5 kg)   BP Readings from Last 3 Encounters:  11/11/16 (!) 148/88  03/12/14 138/76  08/05/12 (!) 150/84   Pulse Readings from Last 3 Encounters:  11/11/16 (!) 54  03/12/14 67  08/05/12 87    Renal function: CrCl cannot be calculated (No order found.).  Past Medical History:  Diagnosis Date  . Benign essential HTN 11/11/2016  . Chronic kidney disease   . Family history of premature CAD 11/11/2016  .  Hyperlipidemia 11/11/2016  . 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 atorvastatin (LIPITOR) 20 MG tablet Take 1 tablet (20 mg total) by mouth daily. 30 tablet 11  . lisinopril (PRINIVIL,ZESTRIL) 10 MG tablet Take 1 tablet (10 mg total) by mouth daily. 30 tablet 11  . Multiple Vitamin (MULTIVITAMIN) tablet Take 1 tablet by mouth daily.     No current facility-administered medications on file prior to visit.     No Known Allergies   Assessment/Plan:  1. Hypertension - BP improved to 118/29mmHg since starting lisinopril  daily. BP is now at goal <130/25mmHg and pt is feeling well. Will continue current therapy. Pt will continue to monitor his BP at home and call with any concerns, f/u as needed. Checking BMET today with recent addition of ACEi.   Megan E. Supple, PharmD, CPP, BCACP [facility] [address], [place], [place] [address] Phone: [phone]; Fax: [phone] 11/26/2016 7:23 AM

## 2016-11-30 ENCOUNTER — Telehealth: Payer: Self-pay

## 2016-11-30 DIAGNOSIS — I77810 Thoracic aortic ectasia: Secondary | ICD-10-CM

## 2016-11-30 DIAGNOSIS — R931 Abnormal findings on diagnostic imaging of heart and coronary circulation: Secondary | ICD-10-CM

## 2016-11-30 NOTE — Telephone Encounter (Signed)
-----   Message from Quintella Reichert, MD sent at 11/28/2016 10:27 PM EDT ----- Noncardiac findings showed scattered pulmonary nodules up to 4mm - forward this to PCP to followup on.  There is also evidence of fatty liver and calcifications in the aorta.  The aorta is mildly dilated at 40mm.  There is heavy calcification of the coronary arteries with a calcium score at 73% at 244.  ETT was normal. Please get a stress myoview to rule out ischemia.  He needs aggressive risk factor modification.  Please get an FLP and ALT.  Please get a 2D echo to assess aortic dimensions and see if correlate with CT.

## 2016-11-30 NOTE — Telephone Encounter (Signed)
Informed patient of results and verbal understanding expressed.  Myoview, labs, and ECHO ordered for scheduling. Reviewed instructions for stress test and patient has no questions. He agrees with treatment plan.

## 2016-12-02 ENCOUNTER — Telehealth: Payer: Self-pay | Admitting: Cardiology

## 2016-12-02 NOTE — Telephone Encounter (Signed)
New message   Pt is calling asking for call, he did not state what it was in regards to.

## 2016-12-02 NOTE — Telephone Encounter (Signed)
Patient is flying next week to go to [place]. He had [place] GXT but calcium score was elevated so myoview and echo were ordered.  Because of travel plans, these tests could not be scheduled before 4/30. He feels fine and has no symptoms. He also works out regularly and has lost 25 pounds recently.  He would like to know from Dr. Mayford Knife if he is OK to fly and ride on rollercoasters/other theme park rides. He also wants to know if he should have any work out limitations.  To Dr. Mayford Knife.

## 2016-12-03 ENCOUNTER — Ambulatory Visit: Payer: Self-pay | Admitting: Surgery

## 2016-12-03 NOTE — Telephone Encounter (Signed)
Any way to get him in for nuclear stress test at [facility] on Monday?

## 2016-12-03 NOTE — H&P (Signed)
Russell Vincent 12/03/2016 2:13 PM Location: [facility] Patient #: [id number] DOB: 10/09/1950 Married / Language: English / Race: White Male  History of Present Illness (Sanya Kobrin A. Fredricka Bonine MD; 12/03/2016 3:19 PM) Patient words: This is a very nice 66 year old gentleman is referred by his urologist for a left inguinal hernia. He presented there with complaints of testicular pain, several weeks ago. He has not noticed any bulge or groin pain per se. No nausea or emesis, no GI dysfunction. The presenting symptoms have resolved on their own. He has never had any prior abdominal surgery, no prior hernia surgery. 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 He's been on a low carb diet for the last 5 weeks and has actually lost 25 pounds. 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 He is going to have a nuclear myocardial perfusion study on April 30, but so far side from hypertension and hyperlipidemia he has not been found to have any significant medical problems. 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  The patient is a 66 year old male.   Past Surgical History (Janette Ranson, CMA; 12/03/2016 2:15 PM) No pertinent past surgical history  Diagnostic Studies History (Janette Ranson, CMA; 12/03/2016 2:15 PM) Colonoscopy 5-10 years ago  Allergies (Janette Ranson, CMA; 12/03/2016 2:17 PM) No Known Drug Allergies 12/03/2016 Allergies Reconciled  Medication History (Janette Ranson, CMA; 12/03/2016 2:18 PM) Atorvastatin Calcium (  Tablet, Oral) Active. Lisinopril (  Tablet, Oral) Active. Multivitamin Adult (Oral) Active.  Social History (Janette Ranson, CMA; 12/03/2016 2:15 PM) Alcohol use Occasional alcohol use. Caffeine use Coffee. No drug use Tobacco use Never smoker.  Family  History (Janette Ranson, CMA; 12/03/2016 2:15 PM) Arthritis Mother. Heart disease in male family member before age 54 Hypertension Father, Mother.  Other Problems (Janette Ranson, CMA; 12/03/2016 2:15 PM) Diabetes Mellitus High blood pressure Hypercholesterolemia Kidney Stone     Review of Systems (Janette Ranson CMA; 12/03/2016 2:15 PM) General Present- Weight Loss. Not Present- Appetite Loss, Chills, Fatigue, Fever, Night Sweats and Weight Gain. Skin Not Present- Change in Wart/Mole, Dryness, Hives, Jaundice, New Lesions, Non-Healing Wounds, Rash and Ulcer. HEENT Present- Ringing in the Ears and Wears glasses/contact lenses. Not Present- Earache, Hearing Loss, Hoarseness, Nose Bleed, Oral Ulcers, Seasonal Allergies, Sinus Pain, Sore Throat, Visual Disturbances and Yellow Eyes. Respiratory Not Present- Bloody sputum, Chronic Cough, Difficulty Breathing, Snoring and Wheezing. Breast Not Present- Breast Mass, Breast Pain, Nipple Discharge and Skin Changes. Cardiovascular Not Present- Chest Pain, Difficulty Breathing Lying Down, Leg Cramps, Palpitations, Rapid Heart Rate, Shortness of Breath and Swelling of Extremities. Gastrointestinal Present- Constipation. Not Present- Abdominal Pain, Bloating, Bloody Stool, Change in Bowel Habits, Chronic diarrhea, Difficulty Swallowing, Excessive gas, Gets full quickly at meals, Hemorrhoids, Indigestion, Nausea, Rectal Pain and Vomiting. Male Genitourinary Not Present- Blood in Urine, Change in Urinary Stream, Frequency, Impotence, Nocturia, Painful Urination, Urgency and Urine Leakage. Musculoskeletal Not Present- Back Pain, Joint Pain, Joint Stiffness, Muscle Pain, Muscle Weakness and Swelling of Extremities. Neurological Not Present- Decreased Memory, Fainting, Headaches, Numbness, Seizures, Tingling, Tremor, Trouble walking and Weakness. Psychiatric Not Present- Anxiety, Bipolar, Change in Sleep Pattern, Depression, Fearful and Frequent  crying. Endocrine Present- New Diabetes. Not Present- Cold Intolerance, Excessive Hunger, Hair Changes, Heat Intolerance and Hot flashes. Hematology Not Present- Blood Thinners, Easy Bruising,  Excessive bleeding, Gland problems, HIV and Persistent Infections.   Physical Exam (Farris Blash A. Fredricka Bonine MD; 12/03/2016 3:22 PM)  The physical exam findings are as follows: Note:He is alert and well-appearing Normocephalic, neck without mass or thyromegaly Extraocular motion intact, anicteric Moist mucus membranes, good dentition Unlabored respirations, symmetrical air entry Regular rate and rhythm, no pedal edema Abdomen is obese, nondistended and nontender. He has a about a 10 cm midline diastases. Very small umbilical hernia. He has several lipomas in the pubic region, difficult to distinguish from hernia but he does have some laxity with vasalva suggestive of a hernia here. Similar findings on the right side. No tenderness or overlying skin changes. Extremities are warm and without deformity Neuro grossly intact, normal gait Psych mood and affect, appropriate insight    Assessment & Plan (Melitta Tigue A. Fredricka Bonine MD; 12/03/2016 3:23 PM)  LEFT INGUINAL HERNIA (K40.90) Story: I offered him a laparoscopic repair and discussed with him that this would afford the opportunity to repair the other side if there is a small hernia there as well. I described to him the operation and we discussed the risks of surgery including bleeding, infection, pain, scarring, injury to intra-abdominal structures, blood vessels, vas deferens, nerves or bladder. We discussed the small risk of hernia recurrence. He asked appropriate questions all of which were answered. He would like to proceed with hernia repair, but due to various work and family related previously scheduled things, he will likely pursue surgery at the end of May. He will call the office when he is ready to schedule.

## 2016-12-07 NOTE — Telephone Encounter (Signed)
Per Dr. Mayford Knife, informed patient he may fly but cannot ride rollercoasters unless he has stress test. [facility] appointment tomorrow, but patient cannot take time slot as he has another trip. He states he will not ride the rollercoasters. He was grateful for call and agrees with treatment plan.

## 2016-12-16 ENCOUNTER — Telehealth (HOSPITAL_COMMUNITY): Payer: Self-pay | Admitting: *Deleted

## 2016-12-16 NOTE — Telephone Encounter (Signed)
Patient given detailed instructions per Myocardial Perfusion Study Information Sheet for the test on 12/20/16. Patient notified to arrive 15 minutes early and that it is imperative to arrive on time for appointment to keep from having the test rescheduled.  If you need to cancel or reschedule your appointment, please call the office within 24 hours of your appointment. Failure to do so may result in a cancellation of your appointment, and a $50 no show fee. Patient verbalized understanding. Kaithlyn Teagle Jacqueline    

## 2016-12-20 ENCOUNTER — Ambulatory Visit (HOSPITAL_BASED_OUTPATIENT_CLINIC_OR_DEPARTMENT_OTHER): Payer: Commercial Managed Care - PPO

## 2016-12-20 ENCOUNTER — Encounter: Payer: Self-pay | Admitting: Cardiology

## 2016-12-20 ENCOUNTER — Other Ambulatory Visit: Payer: Self-pay

## 2016-12-20 ENCOUNTER — Encounter (INDEPENDENT_AMBULATORY_CARE_PROVIDER_SITE_OTHER): Payer: Self-pay

## 2016-12-20 ENCOUNTER — Other Ambulatory Visit: Payer: Commercial Managed Care - PPO | Admitting: *Deleted

## 2016-12-20 ENCOUNTER — Ambulatory Visit (HOSPITAL_COMMUNITY): Payer: Commercial Managed Care - PPO | Attending: Cardiology

## 2016-12-20 DIAGNOSIS — I110 Hypertensive heart disease with heart failure: Secondary | ICD-10-CM | POA: Insufficient documentation

## 2016-12-20 DIAGNOSIS — I77810 Thoracic aortic ectasia: Secondary | ICD-10-CM

## 2016-12-20 DIAGNOSIS — R9439 Abnormal result of other cardiovascular function study: Secondary | ICD-10-CM | POA: Insufficient documentation

## 2016-12-20 DIAGNOSIS — E782 Mixed hyperlipidemia: Principal | ICD-10-CM

## 2016-12-20 DIAGNOSIS — R931 Abnormal findings on diagnostic imaging of heart and coronary circulation: Secondary | ICD-10-CM | POA: Diagnosis not present

## 2016-12-20 DIAGNOSIS — I420 Dilated cardiomyopathy: Secondary | ICD-10-CM | POA: Diagnosis not present

## 2016-12-20 DIAGNOSIS — I5030 Unspecified diastolic (congestive) heart failure: Secondary | ICD-10-CM | POA: Insufficient documentation

## 2016-12-20 DIAGNOSIS — E1169 Type 2 diabetes mellitus with other specified complication: Secondary | ICD-10-CM

## 2016-12-20 LAB — MYOCARDIAL PERFUSION IMAGING
CHL CUP MPHR: 156 {beats}/min
CHL CUP NUCLEAR SDS: 1
CHL CUP NUCLEAR SRS: 5
CHL CUP NUCLEAR SSS: 6
CSEPED: 10 min
CSEPEW: 11.7 METS
CSEPPHR: 146 {beats}/min
LHR: 0.31
LV dias vol: 127 mL (ref 62–150)
LV sys vol: 56 mL
Percent HR: 93 %
RPE: 16
Rest HR: 56 {beats}/min
TID: 0.9

## 2016-12-20 LAB — LIPID PANEL
CHOLESTEROL TOTAL: 123 mg/dL (ref 100–199)
Chol/HDL Ratio: 2.8 ratio (ref 0.0–5.0)
HDL: 44 mg/dL (ref >39)
LDL Calculated: 59 mg/dL (ref 0–99)
Triglycerides: 99 mg/dL (ref 0–149)
VLDL Cholesterol Cal: 20 mg/dL (ref 5–40)

## 2016-12-20 LAB — HEPATIC FUNCTION PANEL
ALK PHOS: 47 IU/L (ref 39–117)
ALT: 26 IU/L (ref 0–44)
AST: 23 IU/L (ref 0–40)
Albumin: 4.4 g/dL (ref 3.6–4.8)
BILIRUBIN, DIRECT: 0.14 mg/dL (ref 0.00–0.40)
Bilirubin Total: 0.5 mg/dL (ref 0.0–1.2)
Total Protein: 7.1 g/dL (ref 6.0–8.5)

## 2016-12-20 IMAGING — NM NM MISC PROCEDURE
9 series · 54 of 54 positions shown · non-contrast
Comparison: none

[Series 1: wbr_r-proj_st rest · 6.51mm/px · 6 of 64 frames shown]
[frame 6/64]
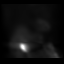
[frame 16/64]
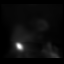
[frame 27/64]
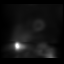
[frame 38/64]
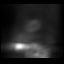
[frame 48/64]
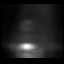
[frame 59/64]
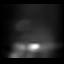

[Series 1: wbr_s-proj_st stress_(id)_sa · 6.5mm · 6.51mm/px · 6 of 512 frames shown (1 of 2)]
[frame 43/512]
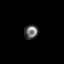
[frame 128/512]
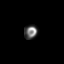
[frame 214/512]
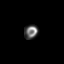
[frame 299/512]
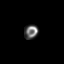
[frame 384/512]
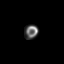
[frame 470/512]
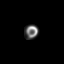

[Series 1: wbr_s-proj_st stress_(id)_sa · 6.5mm · 6.51mm/px · 6 of 64 frames shown (2 of 2)]
[frame 6/64]
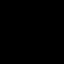
[frame 16/64]
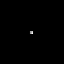
[frame 27/64]
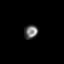
[frame 38/64]
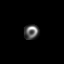
[frame 48/64]
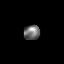
[frame 59/64]
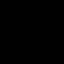

[Series 1: rest · 6.51mm/px · 6 of 64 frames shown]
[frame 6/64]
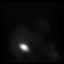
[frame 16/64]
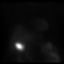
[frame 27/64]
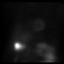
[frame 38/64]
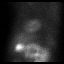
[frame 48/64]
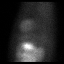
[frame 59/64]
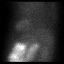

[Series 1: wbr_r-proj_st rest_(id)_sa · 6.5mm · 6.51mm/px · 6 of 64 frames shown]
[frame 6/64]
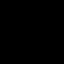
[frame 16/64]
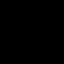
[frame 27/64]
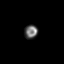
[frame 38/64]
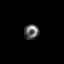
[frame 48/64]
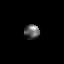
[frame 59/64]
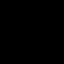

[Series 2: stress · 6.51mm/px · 6 of 512 frames shown (1 of 2)]
[frame 43/512]
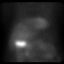
[frame 128/512]
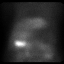
[frame 214/512]
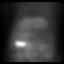
[frame 299/512]
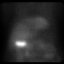
[frame 384/512]
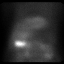
[frame 470/512]
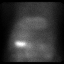

[Series 2: wbr_s-proj_st stress · 6.51mm/px · 6 of 512 frames shown (1 of 2)]
[frame 43/512]
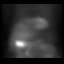
[frame 128/512]
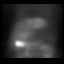
[frame 214/512]
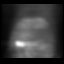
[frame 299/512]
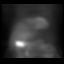
[frame 384/512]
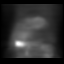
[frame 470/512]
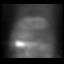

[Series 2: stress · 6.51mm/px · 6 of 64 frames shown (2 of 2)]
[frame 6/64]
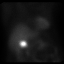
[frame 16/64]
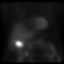
[frame 27/64]
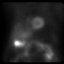
[frame 38/64]
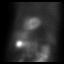
[frame 48/64]
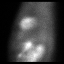
[frame 59/64]
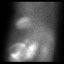

[Series 2: wbr_s-proj_st stress · 6.51mm/px · 6 of 64 frames shown (2 of 2)]
[frame 6/64]
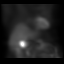
[frame 16/64]
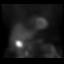
[frame 27/64]
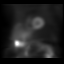
[frame 38/64]
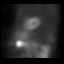
[frame 48/64]
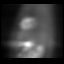
[frame 59/64]
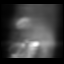

[54 of 54 positions shown; findings below may reference images not displayed]

Canned report from images found in remote index.

Refer to host system for actual result text.

## 2016-12-20 MED ORDER — TECHNETIUM TC 99M TETROFOSMIN IV KIT
10.7000 | PACK | Freq: Once | INTRAVENOUS | Status: AC | PRN
  Administered 2016-12-20: 10.7 via INTRAVENOUS
  Filled 2016-12-20: qty 11

## 2016-12-20 MED ORDER — TECHNETIUM TC 99M TETROFOSMIN IV KIT
32.8000 | PACK | Freq: Once | INTRAVENOUS | Status: AC | PRN
  Administered 2016-12-20: 32.8 via INTRAVENOUS
  Filled 2016-12-20: qty 33

## 2016-12-21 ENCOUNTER — Telehealth: Payer: Self-pay | Admitting: Cardiology

## 2016-12-21 DIAGNOSIS — I77810 Thoracic aortic ectasia: Secondary | ICD-10-CM

## 2016-12-21 NOTE — Telephone Encounter (Signed)
-----   Message from Quintella Reichert, MD sent at 12/20/2016  3:44 PM EDT ----- Lipids at goal continue current therapy

## 2016-12-21 NOTE — Telephone Encounter (Signed)
Informed patient of results and verbal understanding expressed.  Repeat ECHO ordered to be scheduled in 1 year. Patient agrees with treatment plan. 

## 2016-12-21 NOTE — Telephone Encounter (Signed)
-----   Message from Quintella Reichert, MD sent at 12/20/2016  2:33 PM EDT ----- Echo showed normal LVF with increased stiffness of heart muscle, mild AS, mildly dilated aortic root and ascending aorta at 39mm.  Please repeat echo in year for AS and dilated aorta

## 2016-12-21 NOTE — Telephone Encounter (Signed)
-----   Message from Quintella Reichert, MD sent at 12/20/2016  2:31 PM EDT ----- Please let patient know that stress test looks fine

## 2016-12-21 NOTE — Telephone Encounter (Signed)
Patient returning your call, thanks. °

## 2016-12-25 NOTE — Progress Notes (Signed)
normal

## 2017-05-16 ENCOUNTER — Ambulatory Visit (INDEPENDENT_AMBULATORY_CARE_PROVIDER_SITE_OTHER): Payer: Commercial Managed Care - PPO | Admitting: Cardiology

## 2017-05-16 ENCOUNTER — Encounter: Payer: Self-pay | Admitting: Cardiology

## 2017-05-16 VITALS — BP 110/50 | HR 52 | Ht 70.5 in | Wt 228.8 lb

## 2017-05-16 DIAGNOSIS — E782 Mixed hyperlipidemia: Secondary | ICD-10-CM | POA: Diagnosis not present

## 2017-05-16 DIAGNOSIS — I10 Essential (primary) hypertension: Secondary | ICD-10-CM

## 2017-05-16 DIAGNOSIS — E1169 Type 2 diabetes mellitus with other specified complication: Secondary | ICD-10-CM

## 2017-05-16 DIAGNOSIS — Z8249 Family history of ischemic heart disease and other diseases of the circulatory system: Secondary | ICD-10-CM

## 2017-05-16 DIAGNOSIS — I77810 Thoracic aortic ectasia: Secondary | ICD-10-CM | POA: Diagnosis not present

## 2017-05-16 MED ORDER — ASPIRIN EC 81 MG PO TBEC: 81.0000 mg | DELAYED_RELEASE_TABLET | Freq: Every day | ORAL | 11 refills | Status: DC

## 2017-05-16 NOTE — Patient Instructions (Addendum)
Medication Instructions:  START Aspirin 81 mg daily  Labwork: None ordered  Testing/Procedures: Your physician has requested that you have an echocardiogram in April 2019. Echocardiography is a painless test that uses sound waves to create images of your heart. It provides your doctor with information about the size and shape of your heart and how well your heart's chambers and valves are working. This procedure takes approximately one hour. There are no restrictions for this procedure.   Follow-Up: Your physician wants you to follow-up in: 1 year with Dr. Mayford Knife. You will receive a reminder letter in the mail two months in advance. If you don't receive a letter, please call our office to schedule the follow-up appointment.   Any Other Special Instructions Will Be Listed Below (If Applicable).     If you need a refill on your cardiac medications before your next appointment, please call your pharmacy.

## 2017-05-16 NOTE — Progress Notes (Signed)
Cardiology Office Note:    Date:  05/16/2017   ID:  KAYVION ARNESON, DOB 13-May-1951, MRN [id number]  PCP:  Barbie Banner, MD  Cardiologist:  Armanda Magic, MD   Referring MD: Barbie Banner, MD   Chief Complaint  Patient presents with  . Follow-up    HTN, hyperlipidemia and fm hx of CAD    History of Present Illness:    Russell Vincent is a 66 y.o. male with a hx of HTN, type 2 DM , mixed hyperlipidemia with last LDL154 and family history of premature CAD.  He is here today for followup and is doing well.  He denies any chest pain or pressure, SOB, DOE, PND, orthopnea, LE edema, dizziness, palpitations or syncope.  He is compliant with his meds with no drug side effects.    Past Medical History:  Diagnosis Date  . Benign essential HTN 11/11/2016  . Chronic kidney disease   . Dilated aortic root (HCC)    39mm by echo 11/2016  . Family history of premature CAD 11/11/2016  . Hyperlipidemia 11/11/2016  . 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  Current Medications: Current Meds  Medication Sig  . atorvastatin (LIPITOR) 20 MG tablet Take 1 tablet (20 mg total) by mouth daily.  . Coenzyme Q10 (CO Q 10 PO) Take 1 capsule by mouth daily.  Marland Kitchen lisinopril (PRINIVIL,ZESTRIL) 10 MG tablet Take 1 tablet (10 mg total) by mouth daily.  . Multiple Vitamin (MULTIVITAMIN) tablet Take 1 tablet by mouth daily.  . TURMERIC PO Take 2 tablets by mouth daily.     Allergies:   Patient has no known allergies.   Social History   Social History  . Marital status: Married    Spouse name: N/A  . Number of children: N/A  . Years of education: N/A   Social History Main Topics  . Smoking status: Never Smoker  . Smokeless tobacco: Never Used  . Alcohol use Yes     Comment: 2 beers a month  . Drug use: No  . Sexual activity: Yes   Other Topics Concern  . None   Social History Narrative  . None     Family History: The patient's family history  includes Heart disease in his father and mother.  ROS:   Please see the history of present illness.    ROS  All other systems reviewed and negative.   EKGs/Labs/Other Studies Reviewed:    The following studies were reviewed today: none  EKG:  EKG is not  ordered today.   Recent Labs: 11/26/2016: BUN 18; Creatinine, Ser 1.00; Potassium 4.6; Sodium 141 12/20/2016: ALT 26   Recent Lipid Panel    Component Value Date/Time   CHOL 123 12/20/2016 0823   TRIG 99 12/20/2016 0823   HDL 44 12/20/2016 0823   CHOLHDL 2.8 12/20/2016 0823   LDLCALC 59 12/20/2016 0823    Physical Exam:    VS:  BP (!) 110/50   Pulse (!) 52   Ht 5' 10.5" (1.791 m)   Wt 228 lb 12.8 oz (103.8 kg)   BMI 32.37 kg/m     Wt Readings from Last 3 Encounters:  05/16/17 228 lb 12.8 oz (103.8 kg)  11/11/16 260 lb 12.8 oz (118.3 kg)  03/12/14 257 lb (116.6 kg)     GEN:  Well nourished, well developed in no acute distress HEENT: Normal NECK: No JVD; No carotid bruits  LYMPHATICS: No lymphadenopathy CARDIAC: RRR, no murmurs, rubs, gallops RESPIRATORY:  Clear to auscultation without rales, wheezing or rhonchi  ABDOMEN: Soft, non-tender, non-distended MUSCULOSKELETAL:  No edema; No deformity  SKIN: Warm and dry NEUROLOGIC:  Alert and oriented x 3 PSYCHIATRIC:  Normal affect   ASSESSMENT:    1. Benign essential HTN   2. Dilated aortic root (HCC)   3. Mixed diabetic hyperlipidemia associated with type 2 diabetes mellitus (HCC)   4. Family history of premature CAD    PLAN:    In order of problems listed above:  1.  HTN - BP is well controlled on exam today.  He will continue on Lisinopril  daily.  BMET in 11/2016 was normal.    2.  Dilated aortic root (39mm by echo 11/2016) - repeat echo in 1 year.  Continue statin.   3.  Hyperlipidemia - he will continue on atorvastatin  daily.  He has lost over 40lbs with diet and exercise. He is getting his lipids checked by the PCP in a month.    4.  Family  history of premature CAD - he denies any anginal symptoms. Nuclear stress test 11/2016 showed no ischemia.   5.  Coronary Calcium -  score was elevated at 350 so will continue on aggressive risk factor modification.  He has lost 40lbs and his HbA1C has dropped to 5.6%.  I have recommended an ASA  daily.   6.  Mild AS by echo 11/2016.     Medication Adjustments/Labs and Tests Ordered: Current medicines are reviewed at length with the patient today.  Concerns regarding medicines are outlined above.  No orders of the defined types were placed in this encounter.  No orders of the defined types were placed in this encounter.   Signed, Armanda Magic, MD  05/16/2017 9:29 AM    [facility]

## 2017-10-31 ENCOUNTER — Other Ambulatory Visit: Payer: Self-pay | Admitting: Cardiology

## 2017-10-31 MED ORDER — LISINOPRIL 10 MG PO TABS: 10.0000 mg | ORAL_TABLET | Freq: Every day | ORAL | 5 refills | Status: DC

## 2017-10-31 MED ORDER — ATORVASTATIN CALCIUM 20 MG PO TABS: 20.0000 mg | ORAL_TABLET | Freq: Every day | ORAL | 5 refills | Status: DC

## 2017-12-26 ENCOUNTER — Encounter: Payer: Self-pay | Admitting: Cardiology

## 2017-12-26 ENCOUNTER — Ambulatory Visit (HOSPITAL_COMMUNITY): Payer: Commercial Managed Care - PPO | Attending: Cardiovascular Disease

## 2017-12-26 DIAGNOSIS — E785 Hyperlipidemia, unspecified: Secondary | ICD-10-CM | POA: Diagnosis not present

## 2017-12-26 DIAGNOSIS — I313 Pericardial effusion (noninflammatory): Secondary | ICD-10-CM | POA: Insufficient documentation

## 2017-12-26 DIAGNOSIS — E119 Type 2 diabetes mellitus without complications: Secondary | ICD-10-CM | POA: Diagnosis not present

## 2017-12-26 DIAGNOSIS — Z8249 Family history of ischemic heart disease and other diseases of the circulatory system: Secondary | ICD-10-CM | POA: Insufficient documentation

## 2017-12-26 DIAGNOSIS — I77810 Thoracic aortic ectasia: Secondary | ICD-10-CM | POA: Insufficient documentation

## 2017-12-26 DIAGNOSIS — I340 Nonrheumatic mitral (valve) insufficiency: Secondary | ICD-10-CM | POA: Diagnosis not present

## 2017-12-26 DIAGNOSIS — I10 Essential (primary) hypertension: Secondary | ICD-10-CM | POA: Diagnosis not present

## 2018-01-02 ENCOUNTER — Other Ambulatory Visit: Payer: Self-pay | Admitting: Physician Assistant

## 2018-01-02 DIAGNOSIS — R0989 Other specified symptoms and signs involving the circulatory and respiratory systems: Secondary | ICD-10-CM

## 2018-01-04 ENCOUNTER — Other Ambulatory Visit: Payer: Commercial Managed Care - PPO

## 2018-01-09 ENCOUNTER — Ambulatory Visit
Admission: RE | Admit: 2018-01-09 | Discharge: 2018-01-09 | Disposition: A | Discharge status: Home / Self Care | Payer: Commercial Managed Care - PPO | Source: Ambulatory Visit | Attending: Physician Assistant | Admitting: Physician Assistant

## 2018-01-09 DIAGNOSIS — R0989 Other specified symptoms and signs involving the circulatory and respiratory systems: Secondary | ICD-10-CM

## 2018-05-01 ENCOUNTER — Other Ambulatory Visit: Payer: Self-pay | Admitting: Cardiology

## 2018-06-26 ENCOUNTER — Encounter: Payer: Self-pay | Admitting: Cardiology

## 2018-07-17 ENCOUNTER — Encounter: Payer: Self-pay | Admitting: Cardiology

## 2018-07-17 ENCOUNTER — Ambulatory Visit: Payer: Commercial Managed Care - PPO | Admitting: Cardiology

## 2018-07-17 VITALS — BP 104/62 | HR 52 | Ht 70.5 in | Wt 247.4 lb

## 2018-07-17 DIAGNOSIS — Z8249 Family history of ischemic heart disease and other diseases of the circulatory system: Secondary | ICD-10-CM | POA: Diagnosis not present

## 2018-07-17 DIAGNOSIS — I10 Essential (primary) hypertension: Secondary | ICD-10-CM | POA: Diagnosis not present

## 2018-07-17 DIAGNOSIS — E785 Hyperlipidemia, unspecified: Secondary | ICD-10-CM

## 2018-07-17 DIAGNOSIS — I77810 Thoracic aortic ectasia: Secondary | ICD-10-CM

## 2018-07-17 MED ORDER — ASPIRIN EC 81 MG PO TBEC: 81.0000 mg | DELAYED_RELEASE_TABLET | Freq: Every day | ORAL | 3 refills | Status: AC

## 2018-07-17 MED ORDER — LISINOPRIL 10 MG PO TABS: 10.0000 mg | ORAL_TABLET | Freq: Every day | ORAL | 3 refills | Status: DC

## 2018-07-17 MED ORDER — ATORVASTATIN CALCIUM 20 MG PO TABS: 20.0000 mg | ORAL_TABLET | Freq: Every day | ORAL | 3 refills | Status: DC

## 2018-07-17 NOTE — Patient Instructions (Addendum)
Medication Instructions:  Start: Aspirin 81 mg, daily  If you need a refill on your cardiac medications before your next appointment, please call your pharmacy.   Lab work:  If you have labs (blood work) drawn today and your tests are completely normal, you will receive your results only by: Marland Kitchen. MyChart Message (if you have MyChart) OR . A paper copy in the mail If you have any lab test that is abnormal or we need to change your treatment, we will call you to review the results.  Testing/Procedures: Non-Cardiac CT Angiography (CTA), around 12/2018, is a special type of CT scan that uses a computer to produce multi-dimensional views of major blood vessels throughout the body. In CT angiography, a contrast material is injected through an IV to help visualize the blood vessels  Follow-Up: At [facility], you and your health needs are our priority.  As part of our continuing mission to provide you with exceptional heart care, we have created designated Provider Care Teams.  These Care Teams include your primary Cardiologist (physician) and Advanced Practice Providers (APPs -  Physician Assistants and Nurse Practitioners) who all work together to provide you with the care you need, when you need it. You will need a follow up appointment in 1 years.  Please call our office 2 months in advance to schedule this appointment.  You may see Armanda Magicraci Turner, MD or one of the following Advanced Practice Providers on your designated Care Team:   CylinderBrittainy Simmons, PA-C Ronie Spiesayna Dunn, PA-C . Jacolyn ReedyMichele Lenze, PA-C  Any Other Special Instructions Will Be Listed Below (If Applicable).  Call PCP to get fasting lipid and liver sent to our office at [phone]

## 2018-07-17 NOTE — Progress Notes (Signed)
Cardiology Office Note:    Date:  07/17/2018   ID:  Russell Vincent, DOB July 07, 1951, MRN [id number]  PCP:  Russell Banner, MD  Cardiologist:  Russell Magic, MD    Referring MD: Russell Banner, MD   Chief Complaint  Patient presents with  . Follow-up    Dilated aortic root, hypertension, family history of CAD, coronary artery calcifications, hyperlipidemia    History of Present Illness:    Russell Vincent is a 67 y.o. male with a hx of HTN, type 2 DM , mixed hyperlipidemia with last LDL154 and family history of premature CAD.  He is here today for followup and is doing well.  He denies any chest pain or pressure, SOB, DOE, PND, orthopnea, LE edema, dizziness, palpitations or syncope. He is compliant with his meds and is tolerating meds with no SE.    Past Medical History:  Diagnosis Date  . Aortic stenosis    mild AS by echo 12/2017  . Benign essential HTN 11/11/2016  . Chronic kidney disease   . Dilated aortic root (HCC)    39mm by echo 11/2016  . Family history of premature CAD 11/11/2016  . Hyperlipidemia 11/11/2016  . 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  Current Medications: Current Meds  Medication Sig  . atorvastatin (LIPITOR) 20 MG tablet Take 1 tablet (20 mg total) by mouth daily. Please keep upcoming appt in November for future refills. Thank you  . Coenzyme Q10 (CO Q 10 PO) Take 1 capsule by mouth daily.  Marland Kitchen lisinopril (PRINIVIL,ZESTRIL) 10 MG tablet Take 1 tablet (10 mg total) by mouth daily. Please keep upcoming appt in November for future refills. Thank you  . Multiple Vitamin (MULTIVITAMIN) tablet Take 1 tablet by mouth daily.  Marland Kitchen omega-3 acid ethyl esters (LOVAZA) 1 g capsule Take by mouth.  . TURMERIC PO Take 2 tablets by mouth daily.     Allergies:   Patient has no known allergies.   Social History   Socioeconomic History  . Marital status: Married    Spouse name: Not on file  . Number of children: Not on file   . Years of education: Not on file  . Highest education level: Not on file  Occupational History  . Not on file  Social Needs  . Financial resource strain: Not on file  . Food insecurity:    Worry: Not on file    Inability: Not on file  . Transportation needs:    Medical: Not on file    Non-medical: Not on file  Tobacco Use  . Smoking status: Never Smoker  . Smokeless tobacco: Never Used  Substance and Sexual Activity  . Alcohol use: Yes    Comment: 2 beers a month  . Drug use: No  . Sexual activity: Yes  Lifestyle  . Physical activity:    Days per week: Not on file    Minutes per session: Not on file  . Stress: Not on file  Relationships  . Social connections:    Talks on phone: Not on file    Gets together: Not on file    Attends religious service: Not on file    Active member of club or organization: Not on file    Attends meetings of clubs or organizations: Not on file    Relationship status: Not on file  Other Topics Concern  . Not on file  Social History Narrative  .  Not on file     Family History: The patient's family history includes Heart disease in his father and mother.  ROS:   Please see the history of present illness.    ROS  All other systems reviewed and negative.   EKGs/Labs/Other Studies Reviewed:    The following studies were reviewed today: none  EKG:  EKG is  ordered today.  The ekg ordered today demonstrates that his bradycardia 52 bpm with no ST changes.  Recent Labs: No results found for requested labs within last 8760 hours.   Recent Lipid Panel    Component Value Date/Time   CHOL 123 12/20/2016 0823   TRIG 99 12/20/2016 0823   HDL 44 12/20/2016 0823   CHOLHDL 2.8 12/20/2016 0823   LDLCALC 59 12/20/2016 0823    Physical Exam:    VS:  BP 104/62   Pulse (!) 52   Ht 5' 10.5" (1.791 m)   Wt 247 lb 6.4 oz (112.2 kg)   SpO2 93%   BMI 35.00 kg/m     Wt Readings from Last 3 Encounters:  07/17/18 247 lb 6.4 oz (112.2 kg)    05/16/17 228 lb 12.8 oz (103.8 kg)  11/11/16 260 lb 12.8 oz (118.3 kg)     GEN:  Well nourished, well developed in no acute distress HEENT: Normal NECK: No JVD; No carotid bruits LYMPHATICS: No lymphadenopathy CARDIAC: RRR, no murmurs, rubs, gallops RESPIRATORY:  Clear to auscultation without rales, wheezing or rhonchi  ABDOMEN: Soft, non-tender, non-distended MUSCULOSKELETAL:  No edema; No deformity  SKIN: Warm and dry NEUROLOGIC:  Alert and oriented x 3 PSYCHIATRIC:  Normal affect   ASSESSMENT:    1. Dilated aortic root (HCC)   2. Benign essential HTN   3. Family history of premature CAD   4. Hyperlipidemia LDL goal <70    PLAN:    In order of problems listed above:  1.  Dilated aortic root -42 mm by echo 12/26/2017 and ascending aorta at 3.8 cm.  I will check a chest CTA 12/2018 to assess further.  Continue statin therapy.  2.  Hypertension - BP is controlled on exam today.  He will continue on lisinopril 10 mg daily.  3.  Coronary artery calcifications by chest CT-he denies any anginal symptoms.  Calcium scoring on 11/26/2016 was elevated at 244.  Nuclear stress test a year ago showed no ischemia.  He will continue on statin.  I instructed him to start taking a baby aspirin daily.  4.  Hyperlipidemia -his LDL goal is less than 70 given his dilated aortic root and a sending aorta.  His last LDL was 59 on 12/20/2016.  I will repeat an FLP and ALT.  He will continue on atorvastatin 20 mg daily.   4.  Hyperlipidemia -LDL goal is less than 70 given aortic aneurysm.  He will continue on atorvastatin 20 mg daily.   Medication Adjustments/Labs and Tests Ordered: Current medicines are reviewed at length with the patient today.  Concerns regarding medicines are outlined above.  Orders Placed This Encounter  Procedures  . EKG 12-Lead   No orders of the defined types were placed in this encounter.   Signed, Russell Magicraci Omauri Boeve, MD  07/17/2018 9:23 AM    [facility]

## 2018-07-19 ENCOUNTER — Telehealth: Payer: Self-pay

## 2018-07-19 DIAGNOSIS — E785 Hyperlipidemia, unspecified: Secondary | ICD-10-CM

## 2018-07-19 NOTE — Telephone Encounter (Signed)
Dr. Mayford Knifeurner [facility] and she requested having a more recent fasting lab values. Dr. Mayford Knifeurner wanted a lipid and ALT drawn. The patient accepted and it is scheduled on 12/9.

## 2018-07-31 ENCOUNTER — Other Ambulatory Visit: Payer: Commercial Managed Care - PPO | Admitting: *Deleted

## 2018-07-31 DIAGNOSIS — E785 Hyperlipidemia, unspecified: Secondary | ICD-10-CM

## 2018-07-31 LAB — LIPID PANEL
CHOLESTEROL TOTAL: 150 mg/dL (ref 100–199)
Chol/HDL Ratio: 3.1 ratio (ref 0.0–5.0)
HDL: 48 mg/dL (ref >39)
LDL Calculated: 84 mg/dL (ref 0–99)
TRIGLYCERIDES: 92 mg/dL (ref 0–149)
VLDL CHOLESTEROL CAL: 18 mg/dL (ref 5–40)

## 2018-07-31 LAB — ALT: ALT: 24 IU/L (ref 0–44)

## 2018-08-01 ENCOUNTER — Telehealth: Payer: Self-pay

## 2018-08-01 DIAGNOSIS — E785 Hyperlipidemia, unspecified: Secondary | ICD-10-CM

## 2018-08-01 MED ORDER — ATORVASTATIN CALCIUM 40 MG PO TABS: 40.0000 mg | ORAL_TABLET | Freq: Every day | ORAL | 3 refills | Status: DC

## 2018-08-01 NOTE — Telephone Encounter (Signed)
Spoke with the patient, he expressed understanding and accepted increasing atorvastatin to 40 mg daily and repeat fasting labs on 1/20.

## 2018-08-01 NOTE — Telephone Encounter (Signed)
-----   Message from Quintella Reichertraci R Turner, MD sent at 07/31/2018  3:49 PM EST ----- Increase atorvastatin to 40mg  daily and repeat FLP and ALT in 6 weeks (LDL goal < 70)

## 2018-08-25 ENCOUNTER — Other Ambulatory Visit: Payer: Self-pay | Admitting: *Deleted

## 2018-08-25 MED ORDER — LISINOPRIL 10 MG PO TABS: 10.0000 mg | ORAL_TABLET | Freq: Every day | ORAL | 3 refills | Status: DC

## 2018-08-25 MED ORDER — ATORVASTATIN CALCIUM 40 MG PO TABS: 40.0000 mg | ORAL_TABLET | Freq: Every day | ORAL | 3 refills | Status: DC

## 2018-09-11 ENCOUNTER — Other Ambulatory Visit: Payer: Medicare HMO | Admitting: *Deleted

## 2018-09-11 DIAGNOSIS — E785 Hyperlipidemia, unspecified: Secondary | ICD-10-CM

## 2018-09-11 LAB — LIPID PANEL
Chol/HDL Ratio: 2.8 ratio (ref 0.0–5.0)
Cholesterol, Total: 130 mg/dL (ref 100–199)
HDL: 46 mg/dL
LDL Calculated: 59 mg/dL (ref 0–99)
Triglycerides: 125 mg/dL (ref 0–149)
VLDL Cholesterol Cal: 25 mg/dL (ref 5–40)

## 2018-09-11 LAB — ALT: ALT: 23 IU/L (ref 0–44)

## 2018-12-25 ENCOUNTER — Other Ambulatory Visit: Payer: Commercial Managed Care - PPO

## 2018-12-28 ENCOUNTER — Other Ambulatory Visit: Payer: Self-pay

## 2018-12-28 DIAGNOSIS — I10 Essential (primary) hypertension: Secondary | ICD-10-CM

## 2018-12-28 DIAGNOSIS — Z8249 Family history of ischemic heart disease and other diseases of the circulatory system: Secondary | ICD-10-CM

## 2019-01-01 ENCOUNTER — Inpatient Hospital Stay: Admission: RE | Admit: 2019-01-01 | Payer: Commercial Managed Care - PPO | Source: Ambulatory Visit

## 2019-01-22 ENCOUNTER — Other Ambulatory Visit: Payer: Self-pay

## 2019-01-22 ENCOUNTER — Other Ambulatory Visit: Payer: Medicare HMO | Admitting: *Deleted

## 2019-01-22 ENCOUNTER — Encounter (INDEPENDENT_AMBULATORY_CARE_PROVIDER_SITE_OTHER): Payer: Self-pay

## 2019-01-22 DIAGNOSIS — I10 Essential (primary) hypertension: Secondary | ICD-10-CM

## 2019-01-22 DIAGNOSIS — Z8249 Family history of ischemic heart disease and other diseases of the circulatory system: Secondary | ICD-10-CM

## 2019-01-23 LAB — BASIC METABOLIC PANEL
BUN/Creatinine Ratio: 18 (ref 10–24)
BUN: 18 mg/dL (ref 8–27)
CO2: 19 mmol/L — ABNORMAL LOW (ref 20–29)
Calcium: 9.3 mg/dL (ref 8.6–10.2)
Chloride: 105 mmol/L (ref 96–106)
Creatinine, Ser: 1.01 mg/dL (ref 0.76–1.27)
GFR calc Af Amer: 89 mL/min/{1.73_m2} (ref >59)
GFR calc non Af Amer: 77 mL/min/{1.73_m2} (ref >59)
Glucose: 113 mg/dL — ABNORMAL HIGH (ref 65–99)
Potassium: 4.6 mmol/L (ref 3.5–5.2)
Sodium: 140 mmol/L (ref 134–144)

## 2019-01-24 ENCOUNTER — Telehealth: Payer: Self-pay | Admitting: *Deleted

## 2019-01-24 NOTE — Telephone Encounter (Signed)

## 2019-01-26 ENCOUNTER — Other Ambulatory Visit: Payer: Self-pay

## 2019-01-26 ENCOUNTER — Ambulatory Visit (INDEPENDENT_AMBULATORY_CARE_PROVIDER_SITE_OTHER)
Admission: RE | Admit: 2019-01-26 | Discharge: 2019-01-26 | Disposition: A | Discharge status: Home / Self Care | Payer: Medicare HMO | Source: Ambulatory Visit | Attending: Cardiology | Admitting: Cardiology

## 2019-01-26 DIAGNOSIS — I77810 Thoracic aortic ectasia: Secondary | ICD-10-CM

## 2019-01-26 IMAGING — CT CT ANGIOGRAPHY CHEST
2 of 7 series · 18 of 46 positions shown · IV contrast (omnipaque)
Comparison: CT [DATE]

CLINICAL DATA: Follow-up aortic root dilatation

EXAM:
CT ANGIOGRAPHY CHEST WITH CONTRAST
TECHNIQUE: Multidetector CT imaging of the chest was performed using the
standard protocol during bolus administration of intravenous
contrast. Multiplanar CT image reconstructions and MIPs were
obtained to evaluate the vascular anatomy.
CONTRAST:  100mL OMNIPAQUE IOHEXOL 350 MG/ML SOLN

[Series 4: aorta 3.0 i31f 2 · axial · 0.79mm/px · z∈[-368,-32]mm · 15 of 122 slices shown]
[im 5/122  lung]
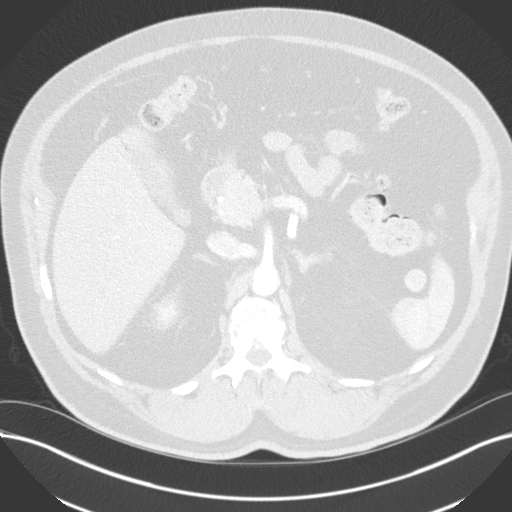
[im 13/122  soft-tissue]
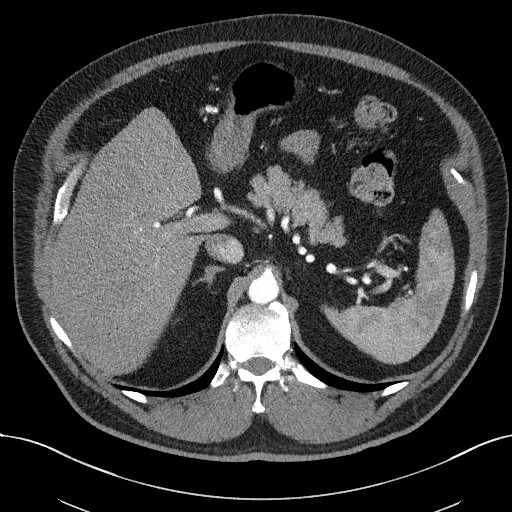
[im 22/122  lung]
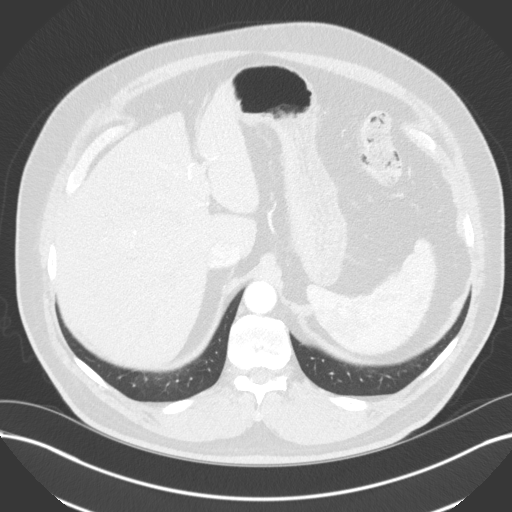
[im 31/122  soft-tissue]
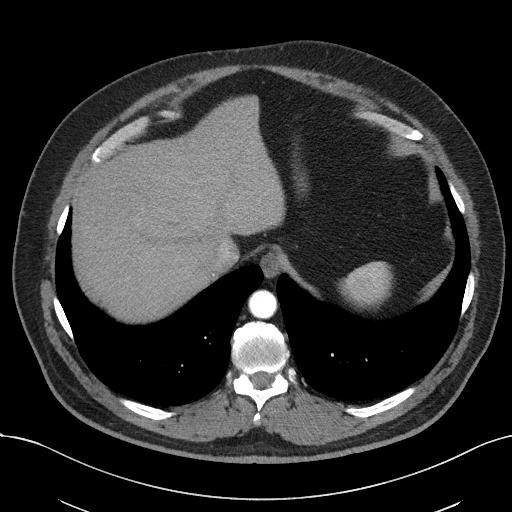
[im 39/122  lung]
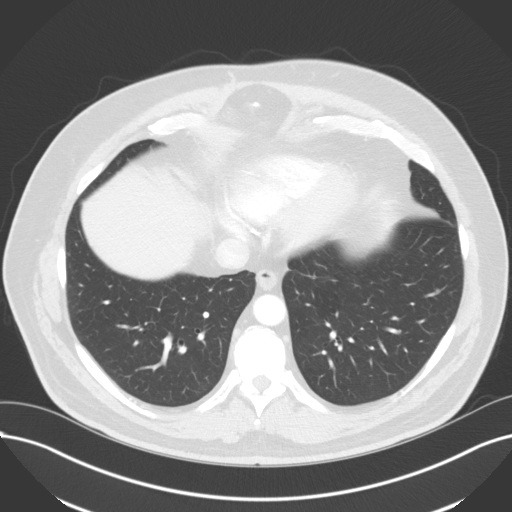
[im 44/122  soft-tissue]
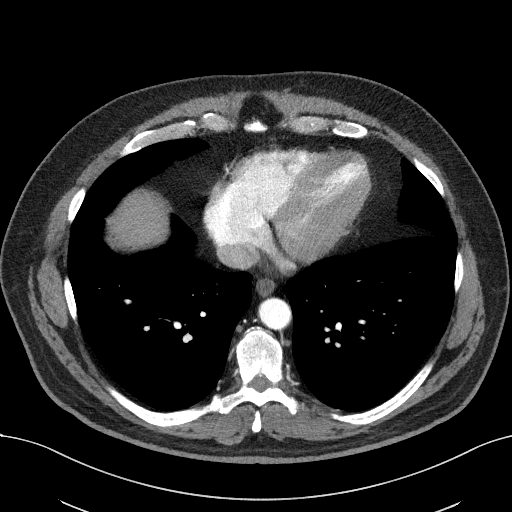
[im 52/122  lung]
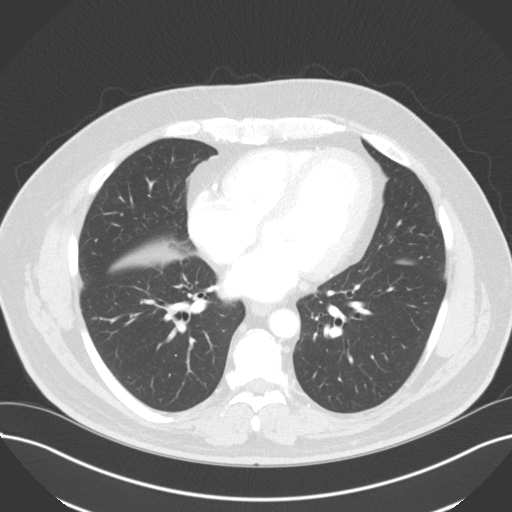
[im 61/122  soft-tissue]
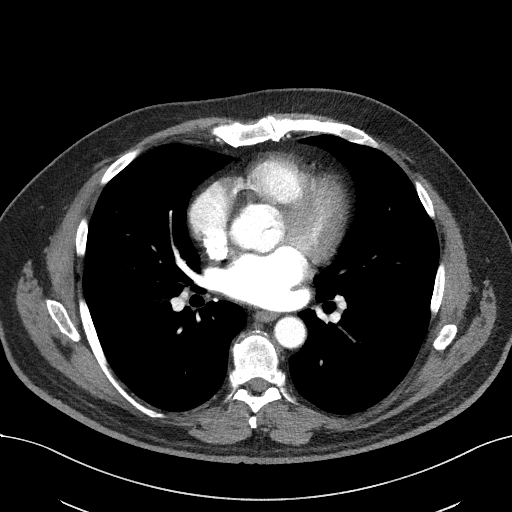
[im 70/122  lung]
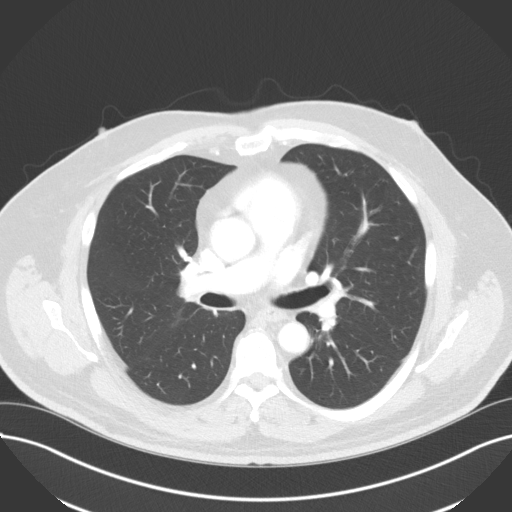
[im 78/122  soft-tissue]
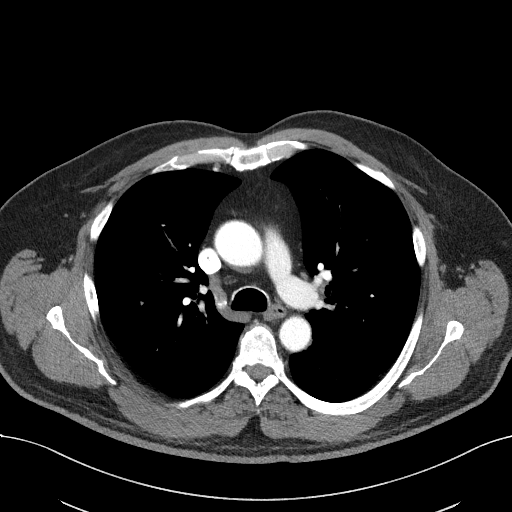
[im 83/122  lung]
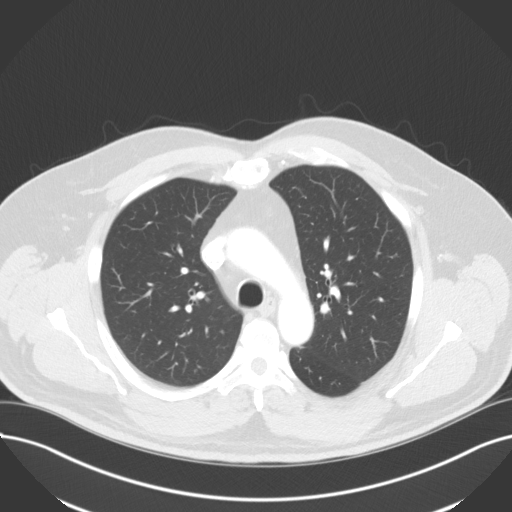
[im 91/122  soft-tissue]
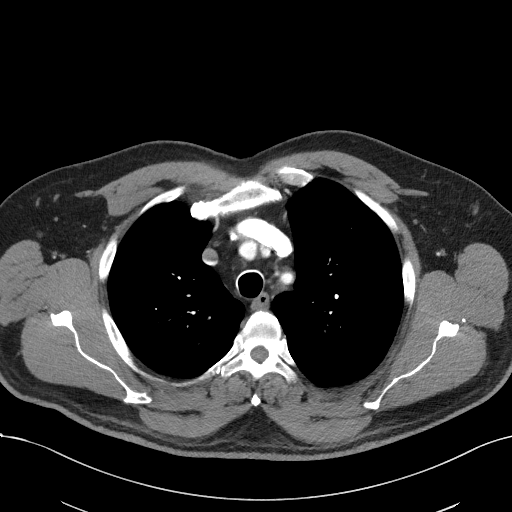
[im 100/122  lung]
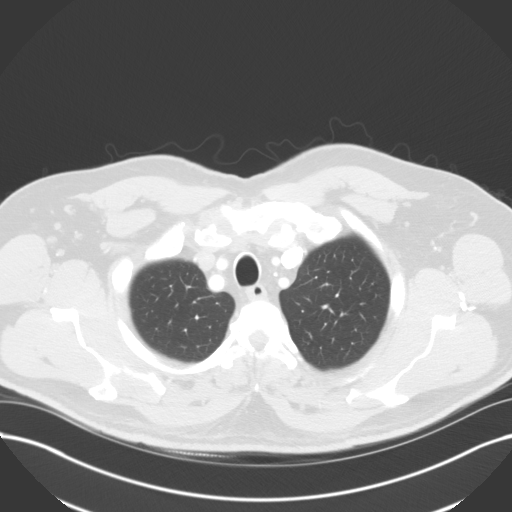
[im 109/122  soft-tissue]
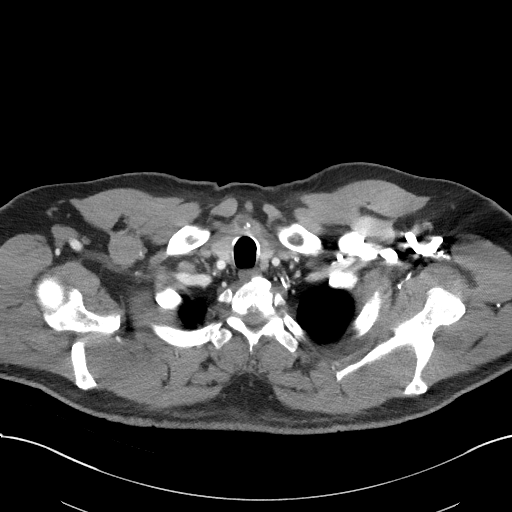
[im 117/122  lung]
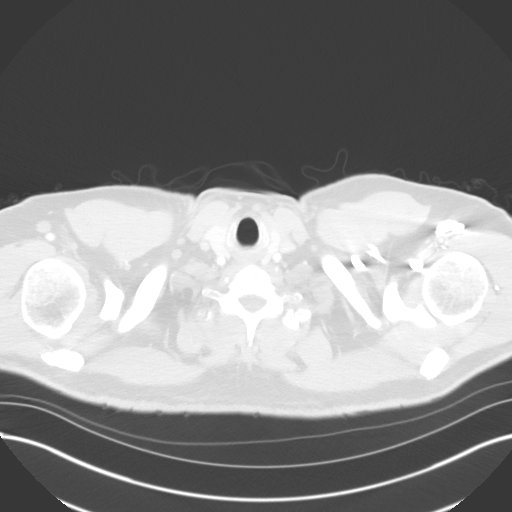

[Series 7: coronals · coronal · 0.70mm/px · 3 of 133 slices shown]
[im 34/133  soft-tissue]
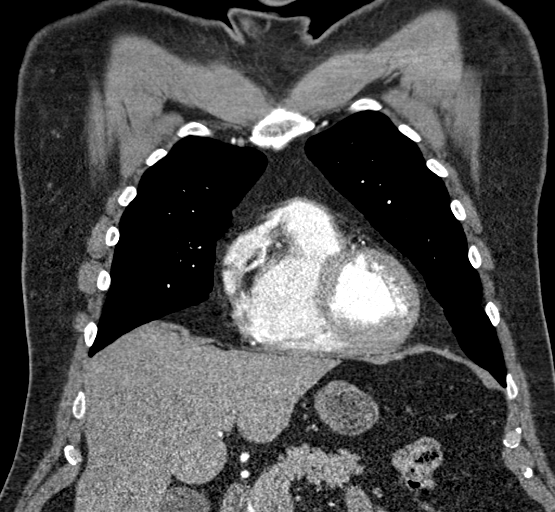
[im 67/133  soft-tissue]
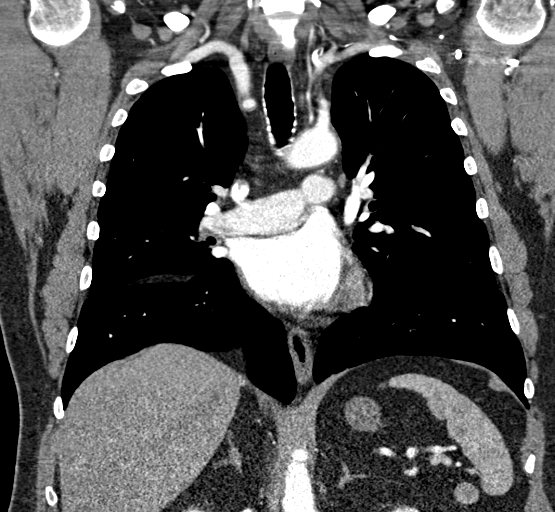
[im 100/133  soft-tissue]
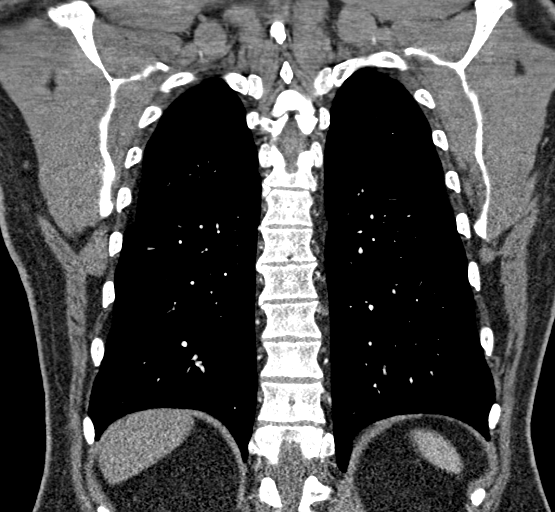

[18 of 46 positions shown; findings below may reference images not displayed]

FINDINGS: Cardiovascular: Mild dilatation of the aortic root measures 4.1 cm
at the sinuses of Valsalva, stable since prior study. Ascending
aortic diameter maximally 3.3 cm. Heart is normal size. Scattered
coronary artery calcifications.

Mediastinum/Nodes: No mediastinal, hilar, or axillary adenopathy.

Lungs/Pleura: 4 mm right middle lobe nodule on image 62, stable.
Smaller 1-2 mm nodule in the right middle lobe on image 70, stable
and image 64, stable. Right lower lobe nodule on image 72 is stable.
Small right lower lobe nodule on image 88 is stable. Left lower lobe
nodule on image 85 stable. No new or enlarging pulmonary nodules. No
effusions.

Upper Abdomen: Imaging into the upper abdomen shows no acute
findings.

Musculoskeletal: Chest wall soft tissues are unremarkable. No acute
bony abnormality.

Review of the MIP images confirms the above findings.
IMPRESSION: Stable aortic root dilatation, 4.1 cm at the sinuses of Valsalva.
Recommend annual imaging followup by CTA or MRA. This recommendation
follows [EP] ACCF/AHA/AATS/ACR/ASA/SCA/TIGER/TIGER/TIGER/TIGER Guidelines
for the Diagnosis and Management of Patients with Thoracic Aortic
Disease. Circulation. [EP]; 121: E266-e369. Aortic aneurysm NOS
([EP]-[EP])

Stable small pulmonary nodules, 4 mm or less in size. Recommend
attention to these nodules on follow-up imaging.

Coronary artery calcifications.

No acute cardiopulmonary disease.

## 2019-01-26 MED ORDER — IOHEXOL 350 MG/ML SOLN
100.0000 mL | Freq: Once | INTRAVENOUS | Status: AC | PRN
  Administered 2019-01-26: 100 mL via INTRAVENOUS

## 2019-01-29 ENCOUNTER — Telehealth: Payer: Self-pay

## 2019-01-29 DIAGNOSIS — I77810 Thoracic aortic ectasia: Secondary | ICD-10-CM

## 2019-01-29 NOTE — Telephone Encounter (Signed)
-----   Message from Sueanne Margarita, MD sent at 01/26/2019  2:00 PM EDT ----- Please let patient know that scan showed stable mildly dilated aortic root at 4.1cm.  Followup with cardiac MRI/MRA next year to avoid excessive radiation with yearly CT

## 2019-01-29 NOTE — Telephone Encounter (Signed)
The patient has been notified of the result and verbalized understanding.  All questions (if any) were answered. Sarina Ill, RN 01/29/2019 3:57 PM

## 2019-06-07 ENCOUNTER — Other Ambulatory Visit: Payer: Self-pay | Admitting: Cardiology

## 2019-08-14 ENCOUNTER — Other Ambulatory Visit: Payer: Self-pay | Admitting: Cardiology

## 2019-10-23 NOTE — Progress Notes (Signed)
Cardiology Office Note:    Date:  10/24/2019   ID:  Russell Vincent, DOB 1950/10/09, MRN [id number]  PCP:  Heywood Bene, PA-C  Cardiologist:  Fransico Him, MD    Referring MD: Christain Sacramento, MD   Chief Complaint  Patient presents with  . Hypertension  . Hyperlipidemia    History of Present Illness:    Russell Vincent is a 69 y.o. male with a hx of HTN, type 2 DM , mixed hyperlipidemia with last LDL154 and family history of premature CAD. He is here today for followup and is doing well.  He denies any chest pain or pressure, SOB, DOE, PND, orthopnea, LE edema, dizziness, palpitations or syncope. He is compliant with his meds and is tolerating meds with no SE.    Past Medical History:  Diagnosis Date  . Aortic stenosis    mild AS by echo 12/2017  . Benign essential HTN 11/11/2016  . Chronic kidney disease   . Dilated aortic root ([place])    4.1cm by CT 01/2019  . Family history of premature CAD 11/11/2016  . Hyperlipidemia 11/11/2016  . 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  Current Medications: Current Meds  Medication Sig  . aspirin EC 81 MG tablet Take 1 tablet (81 mg total) by mouth daily.  Marland Kitchen atorvastatin (LIPITOR) 40 MG tablet Take 1 tablet (40 mg total) by mouth daily at 6 PM. Please make annual appt with Dr. Radford Pax for refills. [phone]. 1st attempt.  . Coenzyme Q10 (CO Q 10 PO) Take 1 capsule by mouth daily.  Marland Kitchen lisinopril (ZESTRIL) 10 MG tablet Take 1 tablet (10 mg total) by mouth daily. Please make annual appt with Dr. Radford Pax for refills. [phone]. 1st attempt.  . Multiple Vitamin (MULTIVITAMIN) tablet Take 1 tablet by mouth daily.  Marland Kitchen omega-3 acid ethyl esters (LOVAZA) 1 g capsule Take by mouth.  . TURMERIC PO Take 2 tablets by mouth daily.  . [DISCONTINUED] atorvastatin (LIPITOR) 40 MG tablet Take 1 tablet (40 mg total) by mouth daily at 6 PM. Please make annual appt with Dr. Radford Pax for refills. [phone].  1st attempt.  . [DISCONTINUED] lisinopril (ZESTRIL) 10 MG tablet Take 1 tablet (10 mg total) by mouth daily. Please make annual appt with Dr. Radford Pax for refills. [phone]. 1st attempt.     Allergies:   Patient has no known allergies.   Social History   Socioeconomic History  . Marital status: Married    Spouse name: Not on file  . Number of children: Not on file  . Years of education: Not on file  . Highest education level: Not on file  Occupational History  . Not on file  Tobacco Use  . Smoking status: Never Smoker  . Smokeless tobacco: Never Used  Substance and Sexual Activity  . Alcohol use: Yes    Comment: 2 beers a month  . Drug use: No  . Sexual activity: Yes  Other Topics Concern  . Not on file  Social History Narrative  . Not on file   Social Determinants of Health   Financial Resource Strain:   . Difficulty of Paying Living Expenses: Not on file  Food Insecurity:   . Worried About Charity fundraiser in the Last Year: Not on file  . Ran Out of Food in the Last Year: Not on file  Transportation Needs:   . Lack of Transportation (Medical): Not on file  .  Lack of Transportation (Non-Medical): Not on file  Physical Activity:   . Days of Exercise per Week: Not on file  . Minutes of Exercise per Session: Not on file  Stress:   . Feeling of Stress : Not on file  Social Connections:   . Frequency of Communication with Friends and Family: Not on file  . Frequency of Social Gatherings with Friends and Family: Not on file  . Attends Religious Services: Not on file  . Active Member of Clubs or Organizations: Not on file  . Attends Banker Meetings: Not on file  . Marital Status: Not on file     Family History: The patient's family history includes Heart disease in his father and mother.  ROS:   Please see the history of present illness.    ROS  All other systems reviewed and negative.   EKGs/Labs/Other Studies Reviewed:    The following  studies were reviewed today: none  EKG:  EKG is  ordered today.  The ekg ordered today demonstrates NSR with no ST changes  Recent Labs: 01/22/2019: BUN 18; Creatinine, Ser 1.01; Potassium 4.6; Sodium 140   Recent Lipid Panel    Component Value Date/Time   CHOL 130 09/11/2018 0959   TRIG 125 09/11/2018 0959   HDL 46 09/11/2018 0959   CHOLHDL 2.8 09/11/2018 0959   LDLCALC 59 09/11/2018 0959    Physical Exam:    VS:  BP 134/68   Pulse (!) 53   Ht 5' 10.5" (1.791 m)   Wt 253 lb 6.4 oz (114.9 kg)   BMI 35.85 kg/m     Wt Readings from Last 3 Encounters:  10/24/19 253 lb 6.4 oz (114.9 kg)  07/17/18 247 lb 6.4 oz (112.2 kg)  05/16/17 228 lb 12.8 oz (103.8 kg)     GEN:  Well nourished, well developed in no acute distress HEENT: Normal NECK: No JVD; No carotid bruits LYMPHATICS: No lymphadenopathy CARDIAC: RRR, no murmurs, rubs, gallops RESPIRATORY:  Clear to auscultation without rales, wheezing or rhonchi  ABDOMEN: Soft, non-tender, non-distended MUSCULOSKELETAL:  No edema; No deformity  SKIN: Warm and dry NEUROLOGIC:  Alert and oriented x 3 PSYCHIATRIC:  Normal affect   ASSESSMENT:    1. Dilated aortic root (HCC)   2. Benign essential HTN   3. Coronary artery calcification   4. Mixed diabetic hyperlipidemia associated with type 2 diabetes mellitus (HCC)   5. DM type 2, goal HbA1c < 7% (HCC)   6. Carotid bruit, unspecified laterality   7. Nonrheumatic aortic valve stenosis    PLAN:    In order of problems listed above:  1.  Dilated aortic root -Chest CTA 01/2019 showed sinus of valsalva dimensions of 4.1cm.   -repeat echo to reassess dimensions 01/2020 -BP controlled -continue statin  2.  HTN -BP controlled -continue Lisinopril 10mg  daily -Creatinine reviewed on outside labs and was 1.01 in Jan 2021 and K+ was 4.5  3.  Coronary artery calcifications on chest CT -Calcium scoring on 11/26/2016 was elevated at 244.   -Nuclear stress test a year ago showed no  ischemia -he denies any angina symptoms  4.  HLD -LDL goal < 70 -LDL on outside labs from PCP reviewed on KPN and showed an LDL of 59 a year ago -normal ALT at 25 in Jan 2021 -repeat FLP  -continue atorvastatin 20mg  daily  5.  Type 2 Dm -followed by PCP -diet controlled  6.  Aortic stenosis -mild by echo 2019 -repeat 2D  echo  7.  Carotid bruits -right>left -repeat carotid dopplers   Medication Adjustments/Labs and Tests Ordered: Current medicines are reviewed at length with the patient today.  Concerns regarding medicines are outlined above.  Orders Placed This Encounter  Procedures  . Lipid panel  . ECHOCARDIOGRAM COMPLETE  . VAS US CAROTID   Meds ordered this encounter  Medications  . atorvastatin (LIPITOR) 40 MG tablet    Sig: Take 1 tablet (40 mg total) by mouth daily at 6 PM. Please make annual appt with Dr. Mayford Knife for refills. [phone]. 1st attempt.    Dispense:  90 tablet    Refill:  3  . lisinopril (ZESTRIL) 10 MG tablet    Sig: Take 1 tablet (10 mg total) by mouth daily. Please make annual appt with Dr. Mayford Knife for refills. [phone]. 1st attempt.    Dispense:  90 tablet    Refill:  3    Signed, Armanda Magic, MD  10/24/2019 2:47 PM    [facility]

## 2019-10-24 ENCOUNTER — Ambulatory Visit: Payer: Medicare HMO | Admitting: Cardiology

## 2019-10-24 ENCOUNTER — Telehealth: Payer: Self-pay

## 2019-10-24 ENCOUNTER — Encounter: Payer: Self-pay | Admitting: Cardiology

## 2019-10-24 ENCOUNTER — Encounter (INDEPENDENT_AMBULATORY_CARE_PROVIDER_SITE_OTHER): Payer: Self-pay

## 2019-10-24 ENCOUNTER — Other Ambulatory Visit: Payer: Self-pay

## 2019-10-24 VITALS — BP 134/68 | HR 53 | Ht 70.5 in | Wt 253.4 lb

## 2019-10-24 DIAGNOSIS — R0989 Other specified symptoms and signs involving the circulatory and respiratory systems: Secondary | ICD-10-CM

## 2019-10-24 DIAGNOSIS — I2510 Atherosclerotic heart disease of native coronary artery without angina pectoris: Secondary | ICD-10-CM

## 2019-10-24 DIAGNOSIS — I2584 Coronary atherosclerosis due to calcified coronary lesion: Secondary | ICD-10-CM

## 2019-10-24 DIAGNOSIS — I77810 Thoracic aortic ectasia: Secondary | ICD-10-CM | POA: Diagnosis not present

## 2019-10-24 DIAGNOSIS — I350 Nonrheumatic aortic (valve) stenosis: Secondary | ICD-10-CM

## 2019-10-24 DIAGNOSIS — E782 Mixed hyperlipidemia: Secondary | ICD-10-CM

## 2019-10-24 DIAGNOSIS — E119 Type 2 diabetes mellitus without complications: Secondary | ICD-10-CM

## 2019-10-24 DIAGNOSIS — E1169 Type 2 diabetes mellitus with other specified complication: Secondary | ICD-10-CM

## 2019-10-24 DIAGNOSIS — I10 Essential (primary) hypertension: Secondary | ICD-10-CM

## 2019-10-24 MED ORDER — LISINOPRIL 10 MG PO TABS: 10.0000 mg | ORAL_TABLET | Freq: Every day | ORAL | 3 refills | Status: DC

## 2019-10-24 MED ORDER — ATORVASTATIN CALCIUM 40 MG PO TABS: 40.0000 mg | ORAL_TABLET | Freq: Every day | ORAL | 3 refills | Status: DC

## 2019-10-24 NOTE — Patient Instructions (Signed)
Medication Instructions:  Your physician recommends that you continue on your current medications as directed. Please refer to the Current Medication list given to you today.  *If you need a refill on your cardiac medications before your next appointment, please call your pharmacy*   Lab Work: Fasting lipid panel   If you have labs (blood work) drawn today and your tests are completely normal, you will receive your results only by: Marland Kitchen MyChart Message (if you have MyChart) OR . A paper copy in the mail If you have any lab test that is abnormal or we need to change your treatment, we will call you to review the results.   Testing/Procedures: Your physician has requested that you have an echocardiogram in June 2021. Echocardiography is a painless test that uses sound waves to create images of your heart. It provides your doctor with information about the size and shape of your heart and how well your heart's chambers and valves are working. This procedure takes approximately one hour. There are no restrictions for this procedure.  Your physician has requested that you have a carotid duplex. This test is an ultrasound of the carotid arteries in your neck. It looks at blood flow through these arteries that supply the brain with blood. Allow one hour for this exam. There are no restrictions or special instructions.   Follow-Up: At [facility], you and your health needs are our priority.  As part of our continuing mission to provide you with exceptional heart care, we have created designated Provider Care Teams.  These Care Teams include your primary Cardiologist (physician) and Advanced Practice Providers (APPs -  Physician Assistants and Nurse Practitioners) who all work together to provide you with the care you need, when you need it.  We recommend signing up for the patient portal called "MyChart".  Sign up information is provided on this After Visit Summary.  MyChart is used to connect with  patients for Virtual Visits (Telemedicine).  Patients are able to view lab/test results, encounter notes, upcoming appointments, etc.  Non-urgent messages can be sent to your provider as well.   To learn more about what you can do with MyChart, go to [email or web address].    Your next appointment:   1 year(s)  The format for your next appointment:   In Person  Provider:   Armanda Magic, MD

## 2019-10-24 NOTE — Telephone Encounter (Signed)
Left message to call back to schedule lab work (fasting lipids)

## 2019-10-25 NOTE — Telephone Encounter (Signed)
Patient is having labs drawn at [facility] office when he goes to have his carotid ultrasound.

## 2019-11-02 NOTE — Addendum Note (Signed)
Addended by: Madalyn Rob A on: 11/02/2019 02:18 PM   Modules accepted: Orders

## 2019-11-12 ENCOUNTER — Ambulatory Visit (HOSPITAL_COMMUNITY)
Admission: RE | Admit: 2019-11-12 | Discharge: 2019-11-12 | Disposition: A | Discharge status: Home / Self Care | Payer: Medicare HMO | Source: Ambulatory Visit | Attending: Cardiology | Admitting: Cardiology

## 2019-11-12 ENCOUNTER — Other Ambulatory Visit: Payer: Self-pay

## 2019-11-12 DIAGNOSIS — I2510 Atherosclerotic heart disease of native coronary artery without angina pectoris: Secondary | ICD-10-CM | POA: Diagnosis present

## 2019-11-12 DIAGNOSIS — I2584 Coronary atherosclerosis due to calcified coronary lesion: Secondary | ICD-10-CM | POA: Diagnosis present

## 2019-11-12 DIAGNOSIS — R0989 Other specified symptoms and signs involving the circulatory and respiratory systems: Secondary | ICD-10-CM | POA: Insufficient documentation

## 2019-11-22 ENCOUNTER — Telehealth: Payer: Self-pay

## 2019-11-22 DIAGNOSIS — I2584 Coronary atherosclerosis due to calcified coronary lesion: Secondary | ICD-10-CM

## 2019-11-22 DIAGNOSIS — I2510 Atherosclerotic heart disease of native coronary artery without angina pectoris: Secondary | ICD-10-CM

## 2019-11-22 DIAGNOSIS — R0989 Other specified symptoms and signs involving the circulatory and respiratory systems: Secondary | ICD-10-CM

## 2019-11-22 NOTE — Telephone Encounter (Signed)
-----   Message from Sigurd Sos, RN sent at 11/13/2019  1:32 PM EDT -----  ----- Message ----- From: Quintella Reichert, MD Sent: 11/12/2019  11:23 AM EDT To: [facility] Triage  Carotid dopplers showed 1-39% bilateral stenosis and Right subclavian stenosis - please find out if patient has any right arm pain or weakness.  Repeat dopplers in 2 years

## 2019-11-22 NOTE — Telephone Encounter (Signed)
The patient has been notified of the result and verbalized understanding.  All questions (if any) were answered. Theresia Majors, RN 11/22/2019 9:32 AM

## 2020-01-28 ENCOUNTER — Ambulatory Visit (HOSPITAL_COMMUNITY): Payer: Medicare HMO | Attending: Cardiovascular Disease

## 2020-01-28 ENCOUNTER — Other Ambulatory Visit: Payer: Self-pay

## 2020-01-28 ENCOUNTER — Encounter: Payer: Self-pay | Admitting: Cardiology

## 2020-01-28 ENCOUNTER — Telehealth: Payer: Self-pay

## 2020-01-28 DIAGNOSIS — I350 Nonrheumatic aortic (valve) stenosis: Secondary | ICD-10-CM | POA: Insufficient documentation

## 2020-01-28 DIAGNOSIS — R0989 Other specified symptoms and signs involving the circulatory and respiratory systems: Secondary | ICD-10-CM

## 2020-01-28 DIAGNOSIS — I77810 Thoracic aortic ectasia: Secondary | ICD-10-CM | POA: Insufficient documentation

## 2020-01-28 NOTE — Telephone Encounter (Signed)
The patient has been notified of the result and verbalized understanding.  All questions (if any) were answered. Theresia Majors, RN 01/28/2020 4:50 PM

## 2020-01-28 NOTE — Telephone Encounter (Signed)
-----   Message from Quintella Reichert, MD sent at 01/28/2020  1:30 PM EDT ----- Echo shows normal heart function with mildly thickened heart muscle, mildly enlarged LA, trivial leakiness of MV and milldly thickened AV with no AS and mildly dilated ascending aorta (borderline enlarged) - repeat limited echo in 1 year for dilated aorta

## 2020-11-12 ENCOUNTER — Ambulatory Visit (HOSPITAL_COMMUNITY)
Admission: RE | Admit: 2020-11-12 | Discharge: 2020-11-12 | Disposition: A | Discharge status: Home / Self Care | Payer: Medicare HMO | Source: Ambulatory Visit | Attending: Cardiology | Admitting: Cardiology

## 2020-11-12 ENCOUNTER — Other Ambulatory Visit: Payer: Self-pay

## 2020-11-12 DIAGNOSIS — I2510 Atherosclerotic heart disease of native coronary artery without angina pectoris: Secondary | ICD-10-CM | POA: Diagnosis present

## 2020-11-12 DIAGNOSIS — I2584 Coronary atherosclerosis due to calcified coronary lesion: Secondary | ICD-10-CM | POA: Insufficient documentation

## 2020-11-12 DIAGNOSIS — R0989 Other specified symptoms and signs involving the circulatory and respiratory systems: Secondary | ICD-10-CM | POA: Insufficient documentation

## 2020-11-14 ENCOUNTER — Encounter: Payer: Self-pay | Admitting: Cardiology

## 2020-11-14 DIAGNOSIS — I6521 Occlusion and stenosis of right carotid artery: Secondary | ICD-10-CM | POA: Insufficient documentation

## 2020-11-21 ENCOUNTER — Telehealth: Payer: Self-pay | Admitting: Cardiology

## 2020-11-21 NOTE — Telephone Encounter (Signed)
Patient is returning call to discuss Carotid results. °

## 2020-11-21 NOTE — Telephone Encounter (Signed)
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  All questions (if any) were answered. Theresia Majors, RN 11/21/2020 11:50 AM  Patient denies right arm numbness, pain, or weakness

## 2021-01-02 ENCOUNTER — Other Ambulatory Visit: Payer: Self-pay

## 2021-01-02 ENCOUNTER — Ambulatory Visit: Payer: Medicare HMO | Admitting: Cardiology

## 2021-01-02 ENCOUNTER — Encounter: Payer: Self-pay | Admitting: Cardiology

## 2021-01-02 VITALS — BP 114/72 | HR 50 | Ht 71.0 in | Wt 252.0 lb

## 2021-01-02 DIAGNOSIS — N182 Chronic kidney disease, stage 2 (mild): Secondary | ICD-10-CM

## 2021-01-02 DIAGNOSIS — I712 Thoracic aortic aneurysm, without rupture, unspecified: Secondary | ICD-10-CM

## 2021-01-02 DIAGNOSIS — I350 Nonrheumatic aortic (valve) stenosis: Secondary | ICD-10-CM

## 2021-01-02 DIAGNOSIS — E1169 Type 2 diabetes mellitus with other specified complication: Secondary | ICD-10-CM

## 2021-01-02 DIAGNOSIS — I2584 Coronary atherosclerosis due to calcified coronary lesion: Secondary | ICD-10-CM

## 2021-01-02 DIAGNOSIS — I2510 Atherosclerotic heart disease of native coronary artery without angina pectoris: Secondary | ICD-10-CM | POA: Diagnosis not present

## 2021-01-02 DIAGNOSIS — I77810 Thoracic aortic ectasia: Secondary | ICD-10-CM | POA: Diagnosis not present

## 2021-01-02 DIAGNOSIS — I10 Essential (primary) hypertension: Secondary | ICD-10-CM

## 2021-01-02 DIAGNOSIS — I6521 Occlusion and stenosis of right carotid artery: Secondary | ICD-10-CM

## 2021-01-02 DIAGNOSIS — E782 Mixed hyperlipidemia: Secondary | ICD-10-CM | POA: Diagnosis not present

## 2021-01-02 DIAGNOSIS — E1122 Type 2 diabetes mellitus with diabetic chronic kidney disease: Secondary | ICD-10-CM

## 2021-01-02 DIAGNOSIS — I129 Hypertensive chronic kidney disease with stage 1 through stage 4 chronic kidney disease, or unspecified chronic kidney disease: Secondary | ICD-10-CM

## 2021-01-02 MED ORDER — LISINOPRIL 10 MG PO TABS: 10.0000 mg | ORAL_TABLET | Freq: Every day | ORAL | 3 refills | Status: DC

## 2021-01-02 MED ORDER — ATORVASTATIN CALCIUM 40 MG PO TABS: 40.0000 mg | ORAL_TABLET | Freq: Every day | ORAL | 3 refills | Status: DC

## 2021-01-02 MED ORDER — OMEGA-3-ACID ETHYL ESTERS 1 G PO CAPS: 1.0000 g | ORAL_CAPSULE | Freq: Every day | ORAL | 3 refills | Status: DC

## 2021-01-02 NOTE — Addendum Note (Signed)
Addended by: Theresia Majors on: 01/02/2021 04:57 PM   Modules accepted: Orders

## 2021-01-02 NOTE — Addendum Note (Signed)
Addended by: Theresia Majors on: 01/02/2021 11:20 AM   Modules accepted: Orders

## 2021-01-02 NOTE — Addendum Note (Signed)
Addended by: Theresia Majors on: 01/02/2021 11:12 AM   Modules accepted: Orders

## 2021-01-02 NOTE — Progress Notes (Signed)
Cardiology Office Note:    Date:  01/02/2021   ID:  Russell Vincent, DOB 07-Dec-1950, MRN [id number]  PCP:  Roger Kill, PA-C  Cardiologist:  Armanda Magic, MD    Referring MD: Roger Kill, *   Chief Complaint  Patient presents with  . Follow-up    Dilated aortic root, HTN, HLD, coronary artery calcifications, HLD, AS    History of Present Illness:    Russell Vincent is a 70 y.o. male with a hx of HTN, type 2 DM , mixed hyperlipidemia with last LDL154 and family history of premature CAD.    He is here today for followup of HTN and HLD and is doing well.  He denies any chest pain or pressure, SOB, DOE, PND, orthopnea, LE edema, dizziness or syncope. He tells me that in April he had an episode of palpitations that lasted about 6 hours and the highest his HR got was 135bpm.  He says that it was also irregular.  He has not had any further episodes since then.  He does not snore at night or gasping for breath during sleep.  He is compliant with his meds and is tolerating meds with no SE.    Past Medical History:  Diagnosis Date  . Aortic stenosis    mild AS by echo 12/2017  . Ascending aorta dilatation (HCC)    4.1cm by CT 01/2019 and 65mm by echo 12/2019  . Benign essential HTN 11/11/2016  . Carotid stenosis, asymptomatic, right    1-39% right carotid stenosis.  RIght subclavian artery stenosis by carotid dopplers 10/2020  . Chronic kidney disease   . Family history of premature CAD 11/11/2016  . Hyperlipidemia 11/11/2016  . 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  Current Medications: Current Meds  Medication Sig  . aspirin EC 81 MG tablet Take 1 tablet (81 mg total) by mouth daily.  Marland Kitchen atorvastatin (LIPITOR) 40 MG tablet Take 1 tablet (40 mg total) by mouth daily at 6 PM. Please make annual appt with Dr. Mayford Knife for refills. [phone]. 1st attempt.  . Coenzyme Q10 (CO Q 10 PO) Take 1 capsule by mouth daily.  Marland Kitchen lisinopril  (ZESTRIL) 10 MG tablet Take 1 tablet (10 mg total) by mouth daily. Please make annual appt with Dr. Mayford Knife for refills. [phone]. 1st attempt.  . Multiple Vitamin (MULTIVITAMIN) tablet Take 1 tablet by mouth daily.  Marland Kitchen omega-3 acid ethyl esters (LOVAZA) 1 g capsule Take by mouth.  . TURMERIC PO Take 2 tablets by mouth daily.     Allergies:   Patient has no known allergies.   Social History   Socioeconomic History  . Marital status: Married    Spouse name: Not on file  . Number of children: Not on file  . Years of education: Not on file  . Highest education level: Not on file  Occupational History  . Not on file  Tobacco Use  . Smoking status: Never Smoker  . Smokeless tobacco: Never Used  Vaping Use  . Vaping Use: Never used  Substance and Sexual Activity  . Alcohol use: Yes    Comment: 2 beers a month  . Drug use: No  . Sexual activity: Yes  Other Topics Concern  . Not on file  Social History Narrative  . Not on file   Social Determinants of Health   Financial Resource Strain: Not on file  Food Insecurity: Not on  file  Transportation Needs: Not on file  Physical Activity: Not on file  Stress: Not on file  Social Connections: Not on file     Family History: The patient's family history includes Heart disease in his father and mother.  ROS:   Please see the history of present illness.    ROS  All other systems reviewed and negative.   EKGs/Labs/Other Studies Reviewed:    The following studies were reviewed today: none  EKG:  EKG is  ordered today.  The ekg ordered today demonstrates sinus bradycardia at 50bpm with no ST changes  Recent Labs: No results found for requested labs within last 8760 hours.   Recent Lipid Panel    Component Value Date/Time   CHOL 130 09/11/2018 0959   TRIG 125 09/11/2018 0959   HDL 46 09/11/2018 0959   CHOLHDL 2.8 09/11/2018 0959   LDLCALC 59 09/11/2018 0959    Physical Exam:    VS:  BP 114/72   Pulse (!) 50   Ht  5\' 11"  (1.803 m)   Wt 252 lb (114.3 kg)   BMI 35.15 kg/m     Wt Readings from Last 3 Encounters:  01/02/21 252 lb (114.3 kg)  10/24/19 253 lb 6.4 oz (114.9 kg)  07/17/18 247 lb 6.4 oz (112.2 kg)     GEN: Well nourished, well developed in no acute distress HEENT: Normal NECK: No JVD; No carotid bruits LYMPHATICS: No lymphadenopathy CARDIAC:RRR, no murmurs, rubs, gallops RESPIRATORY:  Clear to auscultation without rales, wheezing or rhonchi  ABDOMEN: Soft, non-tender, non-distended MUSCULOSKELETAL:  No edema; No deformity  SKIN: Warm and dry NEUROLOGIC:  Alert and oriented x 3 PSYCHIATRIC:  Normal affect    ASSESSMENT:    1. Dilated aortic root (HCC)   2. Benign essential HTN   3. Coronary artery calcification   4. Mixed diabetic hyperlipidemia associated with type 2 diabetes mellitus (HCC)   5. 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 Nonrheumatic aortic valve stenosis   7. Carotid stenosis, asymptomatic, right    PLAN:    In order of problems listed above:  1.  Dilated aortic root -Chest CTA 01/2019 showed sinus of valsalva dimensions of 4.1cm.   -echo 01/2020 reviewed and showed aortic root of 78mm -will get a Chest MRI/MRA to assess further since Chest CTA measurement was higher 2 years ago and there is a CT contrast shortage -BP is adequately controlled on exam today -continue statin  2.  HTN -BP is well controlled on exam today -continue drug prescription management for HTN with Lisinopril 10mg  daily -refill Lisinopril 10mg  daily -check BMET  3.  Coronary artery calcifications on chest CT -Calcium scoring on 11/26/2016 was elevated at 244.   -Nuclear stress test 2018 showed no ischemia -he has not had any anginal symptoms  4.  HLD -LDL goal < 70 -check FLP and ALT -continue prescription drug management with atorvastatin 20mg  daily -refill atorvastatin for 1 year  5.  Type 2 Dm -followed by PCP -diet controlled  6.  Aortic stenosis -mild  by echo 2019 but non on echo 2021  7.  Carotid bruits 1-39% right carotid stenosis and right subclavian artery stenosis -he denies any arm claudication sx or weakness -continue statin  8.  Palpitations -he had 1 episode of palpitations that lasted about 6 hours with HR in the 130's -? PAF vs PAT -He has not had any further palpitations -I have recommended that he purchase the  Kardia Mobile device and send me any strips if he has any further palpitations  Followup with me in 1 year  Medication Adjustments/Labs and Tests Ordered: Current medicines are reviewed at length with the patient today.  Concerns regarding medicines are outlined above.  No orders of the defined types were placed in this encounter.  No orders of the defined types were placed in this encounter.   Signed, Armanda Magic, MD  01/02/2021 10:34 AM    [facility]

## 2021-01-02 NOTE — Patient Instructions (Signed)
Medication Instructions:  Your physician recommends that you continue on your current medications as directed. Please refer to the Current Medication list given to you today.  *If you need a refill on your cardiac medications before your next appointment, please call your pharmacy*   Lab Work: TODAY: FLP & CMET If you have labs (blood work) drawn today and your tests are completely normal, you will receive your results only by: Marland Kitchen MyChart Message (if you have MyChart) OR . A paper copy in the mail If you have any lab test that is abnormal or we need to change your treatment, we will call you to review the results.   Testing/Procedures: Your physician has requested that you have a cardiac MRI. Cardiac MRI uses a computer to create images of your heart as its beating, producing both still and moving pictures of your heart and major blood vessels. For further information please visit [email or web address]. Please follow the instruction sheet given to you today for more information.  Follow-Up: At [facility], you and your health needs are our priority.  As part of our continuing mission to provide you with exceptional heart care, we have created designated Provider Care Teams.  These Care Teams include your primary Cardiologist (physician) and Advanced Practice Providers (APPs -  Physician Assistants and Nurse Practitioners) who all work together to provide you with the care you need, when you need it.  Your next appointment:   1 year(s)  The format for your next appointment:   In Person  Provider:   You may see Armanda Magic, MD or one of the following Advanced Practice Providers on your designated Care Team:    Ronie Spies, PA-C  Jacolyn Reedy, PA-C

## 2021-01-03 LAB — COMPREHENSIVE METABOLIC PANEL
ALT: 26 IU/L (ref 0–44)
AST: 17 IU/L (ref 0–40)
Albumin/Globulin Ratio: 1.7 (ref 1.2–2.2)
Albumin: 4.7 g/dL (ref 3.8–4.8)
Alkaline Phosphatase: 51 IU/L (ref 44–121)
BUN/Creatinine Ratio: 15 (ref 10–24)
BUN: 17 mg/dL (ref 8–27)
Bilirubin Total: 0.3 mg/dL (ref 0.0–1.2)
CO2: 18 mmol/L — ABNORMAL LOW (ref 20–29)
Calcium: 9.6 mg/dL (ref 8.6–10.2)
Chloride: 104 mmol/L (ref 96–106)
Creatinine, Ser: 1.1 mg/dL (ref 0.76–1.27)
Globulin, Total: 2.8 g/dL (ref 1.5–4.5)
Glucose: 155 mg/dL — ABNORMAL HIGH (ref 65–99)
Potassium: 4.8 mmol/L (ref 3.5–5.2)
Sodium: 139 mmol/L (ref 134–144)
Total Protein: 7.5 g/dL (ref 6.0–8.5)
eGFR: 73 mL/min/{1.73_m2} (ref >59)

## 2021-01-03 LAB — LIPID PANEL
Chol/HDL Ratio: 3.4 ratio (ref 0.0–5.0)
Cholesterol, Total: 161 mg/dL (ref 100–199)
HDL: 48 mg/dL (ref >39)
LDL Chol Calc (NIH): 78 mg/dL (ref 0–99)
Triglycerides: 211 mg/dL — ABNORMAL HIGH (ref 0–149)
VLDL Cholesterol Cal: 35 mg/dL (ref 5–40)

## 2021-01-05 ENCOUNTER — Telehealth: Payer: Self-pay | Admitting: Cardiology

## 2021-01-05 ENCOUNTER — Telehealth: Payer: Self-pay

## 2021-01-05 DIAGNOSIS — E782 Mixed hyperlipidemia: Secondary | ICD-10-CM

## 2021-01-05 DIAGNOSIS — E1169 Type 2 diabetes mellitus with other specified complication: Secondary | ICD-10-CM

## 2021-01-05 MED ORDER — ICOSAPENT ETHYL 1 G PO CAPS: 2.0000 g | ORAL_CAPSULE | Freq: Two times a day (BID) | ORAL | 3 refills | Status: DC

## 2021-01-05 MED ORDER — ATORVASTATIN CALCIUM 80 MG PO TABS: 80.0000 mg | ORAL_TABLET | Freq: Every day | ORAL | 3 refills | Status: DC

## 2021-01-05 NOTE — Telephone Encounter (Signed)
Russell Reichert, MD  Russell Vincent, [facility]; Theresia Majors, RN Correction get an FLP and ALT in 6 weeks        ----- Message -----  From: Russell Vincent, [facility]  Sent: 01/05/2021  2:40 PM EDT  To: Russell Reichert, MD, Theresia Majors, RN   Recommend increasing atorvastatin to 80 mg daily and switching Lovaza to Vascepa 2gram twice daily     The patient has been notified of the result and verbalized understanding.  All questions (if any) were answered. Theresia Majors, RN 01/05/2021 4:07 PM  Patient will increase atorvastatin to 80 mg daily and switch from Lovaza to Vascepa 2 g BID. He will call back to schedule lab work once he gets his MRI scheduled so they can be done on the same day.

## 2021-01-05 NOTE — Telephone Encounter (Signed)
-----   Message from Quintella Reichert, MD sent at 01/05/2021  3:54 PM EDT ----- Correction get an FLP and ALT in 6 weeks ----- Message ----- From: Cheree Ditto, [facility] Sent: 01/05/2021   2:40 PM EDT To: Quintella Reichert, MD, Theresia Majors, RN  Recommend increasing atorvastatin to 80 mg daily and switching Lovaza to Vascepa 2gram twice daily   ----- Message ----- From: Theresia Majors, RN Sent: 01/05/2021  12:44 PM EDT To: [facility]  Dr. Mayford Knife would like recommendations from lipid clinic

## 2021-01-05 NOTE — Telephone Encounter (Signed)
Left message for patient to call and discuss preferred scheduling weekdays and times for the Cardiac MRI and MRA chest ordered by Dr. Turner 

## 2021-01-06 ENCOUNTER — Encounter: Payer: Self-pay | Admitting: Cardiology

## 2021-01-06 NOTE — Telephone Encounter (Signed)
Left message for patient regarding the Thursday 01/22/21 2:00pm Cardiac MRI / MRA chest appointment at [facility]---arrival time is 1:30 pm 1st floor admissions office for check in---will mail information to patient and requested he call with questions or concerns.

## 2021-01-12 ENCOUNTER — Telehealth: Payer: Self-pay | Admitting: Cardiology

## 2021-01-12 NOTE — Telephone Encounter (Signed)
Spoke with the patient who has questions about his cholesterol medications. He states that he received atorvastatin 40 mg tablets, Vascepa 1 g tablets and Lovaza. Advised patient that Lovaza was discontinued and switched to Vascepa. Atorvastatin was increased to 80 mg daily. Patient verbalized understanding.

## 2021-01-12 NOTE — Telephone Encounter (Signed)
    Pt c/o medication issue:  1. Name of Medication: heart medicince  2. How are you currently taking this medication (dosage and times per day)?   3. Are you having a reaction (difficulty breathing--STAT)?   4. What is your medication issue? Pt would like to speak with Dr. Norris Cross nurse. He said he just had a bunch of medications and he is confused

## 2021-01-21 ENCOUNTER — Telehealth (HOSPITAL_COMMUNITY): Payer: Self-pay | Admitting: *Deleted

## 2021-01-21 NOTE — Telephone Encounter (Signed)
Attempted to call patient regarding upcoming cardiac MRI appointment. Left message on voicemail with name and callback number  Dajiah Kooi RN Navigator Cardiac Imaging [facility] [phone] Office [phone] Cell  

## 2021-01-21 NOTE — Telephone Encounter (Signed)
Reaching out to patient to offer assistance regarding upcoming cardiac imaging study; pt verbalizes understanding of appt date/time, parking situation and where to check in, and verified current allergies; name and call back number provided for further questions should they arise  Laveda Demedeiros RN Navigator Cardiac Imaging [facility] [phone] office [phone] cell  

## 2021-01-22 ENCOUNTER — Encounter (HOSPITAL_COMMUNITY): Payer: Self-pay

## 2021-01-22 ENCOUNTER — Encounter: Payer: Self-pay | Admitting: Cardiology

## 2021-01-22 ENCOUNTER — Other Ambulatory Visit: Payer: Self-pay

## 2021-01-22 ENCOUNTER — Ambulatory Visit (HOSPITAL_COMMUNITY)
Admission: RE | Admit: 2021-01-22 | Discharge: 2021-01-22 | Disposition: A | Discharge status: Home / Self Care | Payer: Medicare HMO | Source: Ambulatory Visit | Attending: Cardiology | Admitting: Cardiology

## 2021-01-22 ENCOUNTER — Ambulatory Visit (HOSPITAL_COMMUNITY): Payer: Medicare HMO

## 2021-01-22 DIAGNOSIS — I77810 Thoracic aortic ectasia: Secondary | ICD-10-CM | POA: Insufficient documentation

## 2021-01-22 DIAGNOSIS — I712 Thoracic aortic aneurysm, without rupture, unspecified: Secondary | ICD-10-CM

## 2021-01-22 IMAGING — MR MR CARD MORPHOLOGY WO/W CM
44 of 48 series · 44 of 48 positions shown · IV contrast (Contrast agent)
Comparison: none

CLINICAL DATA: Dilated ascending aorta

EXAM:
CARDIAC MRI
TECHNIQUE: The patient was scanned on a 1.5 Tesla GE magnet. A dedicated
cardiac coil was used. Functional imaging was done using Fiesta
sequences. [DATE], and 4 chamber views were done to assess for RWMA's.
Modified KAKI rule using a short axis stack was used to
calculate an ejection fraction on a dedicated work station using
Circle software. The patient received 8 cc of Multihance. MR
angiography was done. After 10 minutes inversion recovery sequences
were used to assess for infiltration and scar tissue.

[Series 4: t2_haste_db_tra_bh · axial · 8.0mm · 1.56mm/px · 1 of 22 slices shown]
[im 1/22]
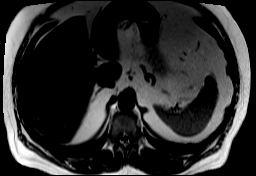

[Series 8: bSSFP · coronal · 8.0mm · 1.61mm/px · 1 of 25 slices shown (1 of 21)]
[im 1/25]
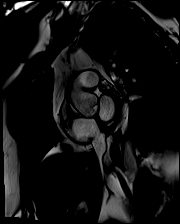

[Series 9: bSSFP · coronal · 8.0mm · 1.61mm/px · 1 of 25 slices shown (2 of 21)]
[im 1/25]
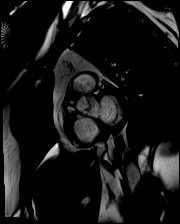

[Series 10: bSSFP · coronal · 8.0mm · 1.61mm/px · 1 of 25 slices shown (3 of 21)]
[im 1/25]
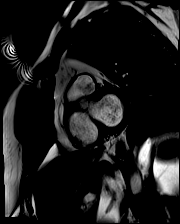

[Series 11: bSSFP · coronal · 8.0mm · 1.61mm/px · 1 of 25 slices shown (4 of 21)]
[im 1/25]
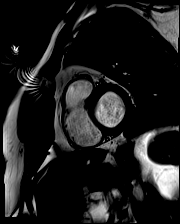

[Series 12: bSSFP · coronal · 8.0mm · 1.61mm/px · 1 of 25 slices shown (5 of 21)]
[im 1/25]
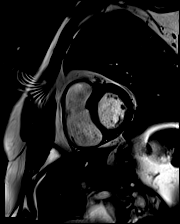

[Series 13: bSSFP · coronal · 8.0mm · 1.70mm/px · 1 of 25 slices shown (6 of 21)]
[im 1/25]
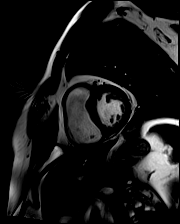

[Series 14: bSSFP · coronal · 8.0mm · 1.70mm/px · 1 of 25 slices shown (7 of 21)]
[im 1/25]
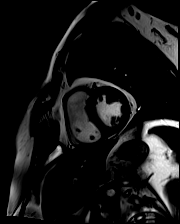

[Series 15: bSSFP · coronal · 8.0mm · 1.61mm/px · 1 of 25 slices shown (8 of 21)]
[im 1/25]
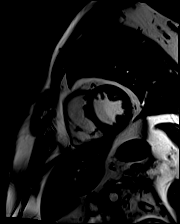

[Series 16: bSSFP · coronal · 8.0mm · 1.70mm/px · 1 of 25 slices shown (9 of 21)]
[im 1/25]
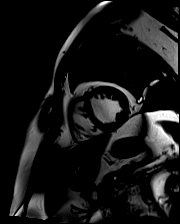

[Series 17: bSSFP · coronal · 8.0mm · 1.70mm/px · 1 of 25 slices shown (10 of 21)]
[im 1/25]
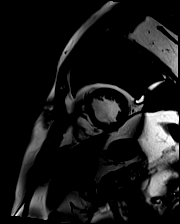

[Series 18: bSSFP · coronal · 8.0mm · 1.70mm/px · 1 of 25 slices shown (11 of 21)]
[im 1/25]
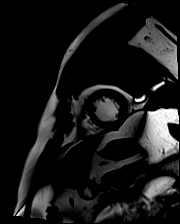

[Series 19: bSSFP · coronal · 8.0mm · 1.70mm/px · 1 of 25 slices shown (12 of 21)]
[im 1/25]
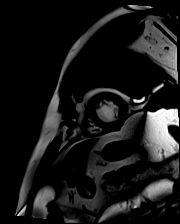

[Series 20: bSSFP · coronal · 8.0mm · 1.70mm/px · 1 of 25 slices shown (13 of 21)]
[im 1/25]
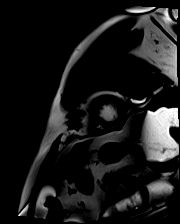

[Series 21: bSSFP · coronal · 8.0mm · 1.70mm/px · 1 of 25 slices shown (14 of 21)]
[im 1/25]
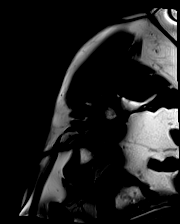

[Series 22: bSSFP · coronal · 8.0mm · 1.70mm/px · 1 of 25 slices shown (15 of 21)]
[im 1/25]
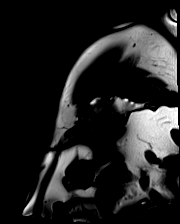

[Series 23: bSSFP · coronal · 8.0mm · 1.70mm/px · 1 of 25 slices shown (16 of 21)]
[im 1/25]
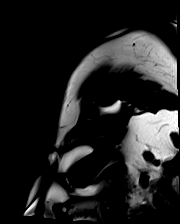

[Series 24: bSSFP · coronal · 8.0mm · 1.70mm/px · 1 of 25 slices shown (17 of 21)]
[im 1/25]
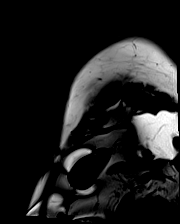

[Series 25: bSSFP · oblique · 6.0mm · 1.41mm/px · 1 of 25 slices shown (18 of 21)]
[im 1/25]
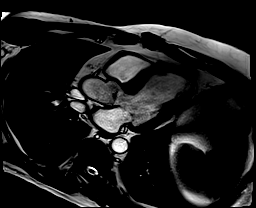

[Series 26: bSSFP · sagittal · 6.0mm · 1.41mm/px · 1 of 25 slices shown (19 of 21)]
[im 1/25]
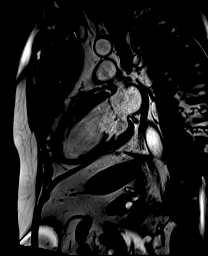

[Series 27: bSSFP · axial · 6.0mm · 1.41mm/px · 1 of 25 slices shown (20 of 21)]
[im 1/25]
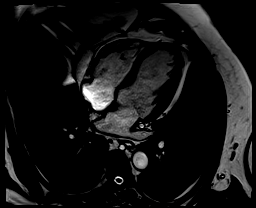

[Series 28: (id)_long_t1 · coronal · 8.0mm · 1.56mm/px · 1 of 24 slices shown]
[im 1/24]
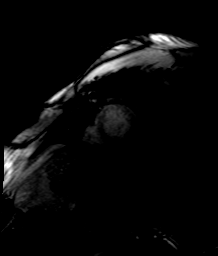

[Series 29: (id)_long_t1_moco · coronal · 8.0mm · 1.56mm/px · 1 of 24 slices shown]
[im 1/24]
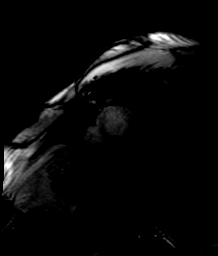

[Series 30: (id)_long_t1_moco_t1 · coronal · 8.0mm · 1.56mm/px · 1 of 6 slices shown]
[im 1/6]
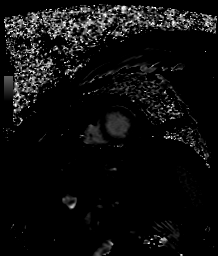

[Series 32: (id)_trufi · coronal · 8.0mm · 2.08mm/px · 1 of 9 slices shown]
[im 1/9]
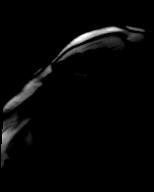

[Series 33: (id)_trufi_moco · coronal · 8.0mm · 2.08mm/px · 1 of 9 slices shown]
[im 1/9]
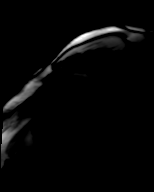

[Series 34: (id)_trufi_moco_t2 · coronal · 8.0mm · 2.08mm/px · 1 of 3 slices shown]
[im 1/3]
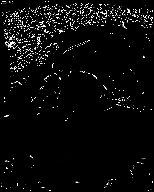

[Series 36: (person_name)_(person_name)_(person_name) · sagittal · 8.0mm · 1.79mm/px · 1 of 25 slices shown (1 of 5)]
[im 1/25]
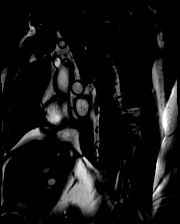

[Series 36: (person_name)_(person_name)_(person_name) · sagittal · 8.0mm · 1.79mm/px · 1 of 25 slices shown (2 of 5)]
[im 1/25]
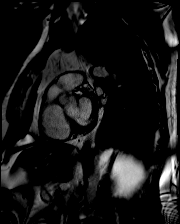

[Series 36: (person_name)_(person_name)_(person_name) · sagittal · 8.0mm · 1.79mm/px · 1 of 25 slices shown (3 of 5)]
[im 1/25]
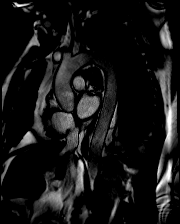

[Series 36: (person_name)_(person_name)_(person_name) · sagittal · 8.0mm · 1.79mm/px · 1 of 25 slices shown (4 of 5)]
[im 1/25]
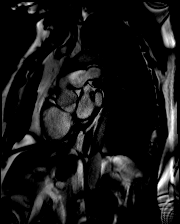

[Series 36: (person_name)_(person_name)_(person_name) · sagittal · 8.0mm · 1.79mm/px · 1 of 25 slices shown (5 of 5)]
[im 1/25]
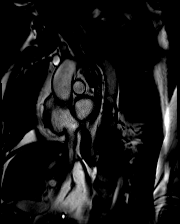

[Series 37: angio_fl3d_sag_pre · sagittal · 1.1mm · 1.17mm/px · 1 of 96 slices shown]
[im 1/96]
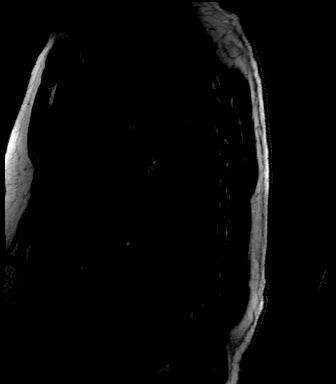

[Series 39: candy cane ce-arterial · sagittal · arterial · 1.1mm · 1.17mm/px · 1 of 96 slices shown]
[im 1/96]
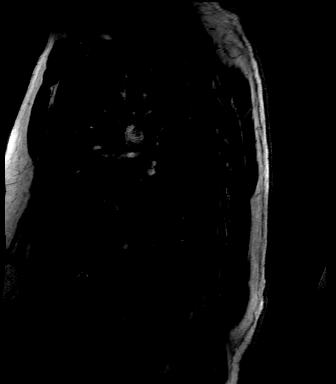

[Series 40: candy cane ce-arterial_sub · sagittal · 1.1mm · 1.17mm/px · 1 of 96 slices shown]
[im 1/96]
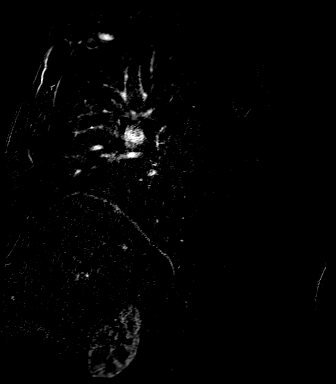

[Series 42: candy cane ce-venous · sagittal · portal-venous · 1.1mm · 1.17mm/px · 1 of 96 slices shown]
[im 1/96]
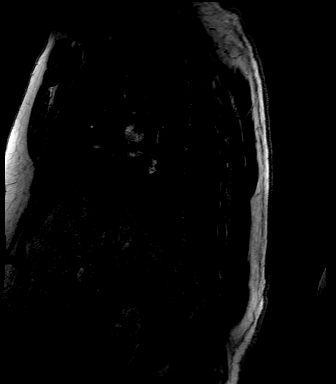

[Series 43: candy cane ce-venous_sub · sagittal · 1.1mm · 1.17mm/px · 1 of 96 slices shown]
[im 1/96]
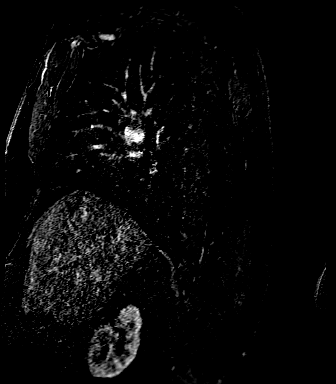

[Series 45: bSSFP · coronal · 6.0mm · 1.41mm/px · 1 of 25 slices shown (21 of 21)]
[im 1/25]
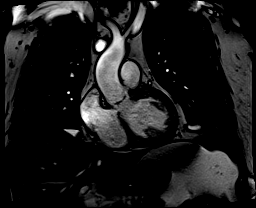

[Series 46: aortic valve cine · oblique · 6.0mm · 1.41mm/px · 1 of 25 slices shown]
[im 1/25]
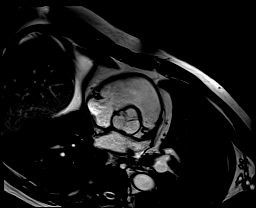

[Series 47: cine rvit · oblique · 6.0mm · 1.41mm/px · 1 of 25 slices shown]
[im 1/25]
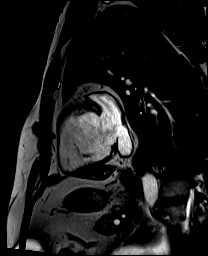

[Series 48: cine rvot · sagittal · 6.0mm · 1.41mm/px · 1 of 25 slices shown]
[im 1/25]
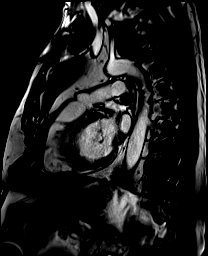

[Series 49: aortic valve flow_200_tp_retro_bh · axial · 6.0mm · 1.73mm/px · 1 of 30 slices shown]
[im 1/30]
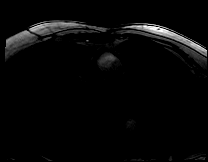

[Series 50: aortic valve flow_200_tp_retro_bh_mag · axial · 6.0mm · 1.73mm/px · 1 of 24 slices shown]
[im 1/24]
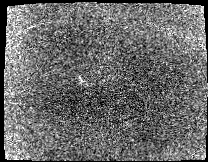

[Series 51: aortic valve flow_200_tp_retro_bh_p · axial · 6.0mm · 1.73mm/px · 1 of 30 slices shown]
[im 1/30]
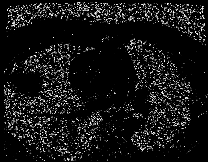

[44 of 48 positions shown; findings below may reference images not displayed]

FINDINGS: MRA reported separately by [REDACTED].

Limited images of the lung fields showed no gross abnormalities.

Normal left ventricular size with mild LV hypertrophy. No wall
motion abnormalities, EF 63%. Normal right ventricular size and
systolic function, EF 53%. Mildly dilated left atrium with normal
right atrial size. Trileaflet, thickened aortic valve with mild
regurgitation, probably no significant stenosis.

On delayed enhancement imaging, there was no myocardial late
gadolinium enhancement (LGE).

Measurements:

LVEDV 208 mL
LVSV 132 mL
LVEF 63%

RVEDV 218 mL
RVSV 115 mL
RVEF 53%
IMPRESSION: 1.  Normal LV size with mild LV hypertrophy.  EF 63%.

2.  Normal RV size and systolic function, EF 53%.

3. Thickened aortic valve with mild AI, probably no significant
stenosis.

4. No myocardial LGE, so no definitive evidence for prior MI,
infiltrative disease, or myocarditis.

KAKI

## 2021-01-22 IMAGING — MR MR MRA CHEST W/ OR W/O CM
44 of 48 series · 44 of 48 positions shown · IV contrast (Contrast agent)
Comparison: CTA chest [DATE]

CLINICAL DATA: Thoracic aortic aneurysm

EXAM:
MRA CHEST WITH OR WITHOUT CONTRAST
TECHNIQUE: Angiographic images of the chest were obtained using MRA technique
without and with intravenous contrast.
CONTRAST:  10mL GADAVIST GADOBUTROL 1 MMOL/ML IV SOLN

[Series 4: t2_haste_db_tra_bh · axial · 8.0mm · 1.56mm/px · 1 of 22 slices shown]
[im 1/22]
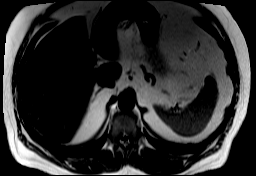

[Series 8: bSSFP · coronal · 8.0mm · 1.61mm/px · 1 of 25 slices shown (1 of 21)]
[im 1/25]
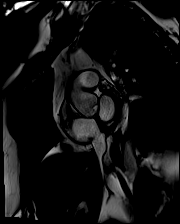

[Series 9: bSSFP · coronal · 8.0mm · 1.61mm/px · 1 of 25 slices shown (2 of 21)]
[im 1/25]
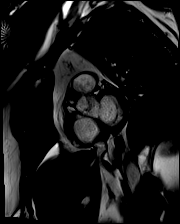

[Series 10: bSSFP · coronal · 8.0mm · 1.61mm/px · 1 of 25 slices shown (3 of 21)]
[im 1/25]
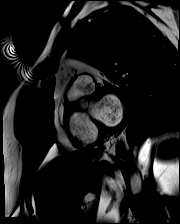

[Series 11: bSSFP · coronal · 8.0mm · 1.61mm/px · 1 of 25 slices shown (4 of 21)]
[im 1/25]
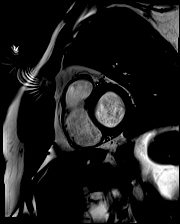

[Series 12: bSSFP · coronal · 8.0mm · 1.61mm/px · 1 of 25 slices shown (5 of 21)]
[im 1/25]
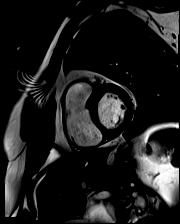

[Series 13: bSSFP · coronal · 8.0mm · 1.70mm/px · 1 of 25 slices shown (6 of 21)]
[im 1/25]
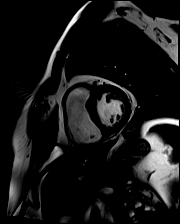

[Series 14: bSSFP · coronal · 8.0mm · 1.70mm/px · 1 of 25 slices shown (7 of 21)]
[im 1/25]
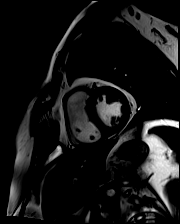

[Series 15: bSSFP · coronal · 8.0mm · 1.61mm/px · 1 of 25 slices shown (8 of 21)]
[im 1/25]
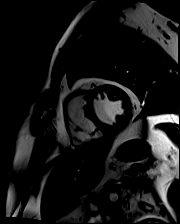

[Series 16: bSSFP · coronal · 8.0mm · 1.70mm/px · 1 of 25 slices shown (9 of 21)]
[im 1/25]
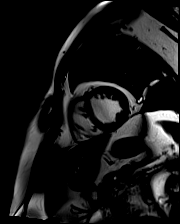

[Series 17: bSSFP · coronal · 8.0mm · 1.70mm/px · 1 of 25 slices shown (10 of 21)]
[im 1/25]
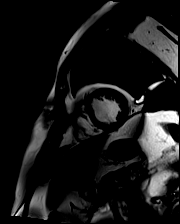

[Series 18: bSSFP · coronal · 8.0mm · 1.70mm/px · 1 of 25 slices shown (11 of 21)]
[im 1/25]
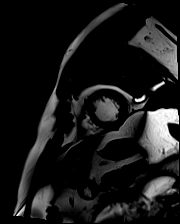

[Series 19: bSSFP · coronal · 8.0mm · 1.70mm/px · 1 of 25 slices shown (12 of 21)]
[im 1/25]
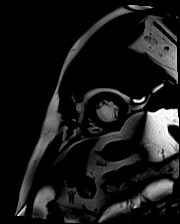

[Series 20: bSSFP · coronal · 8.0mm · 1.70mm/px · 1 of 25 slices shown (13 of 21)]
[im 1/25]
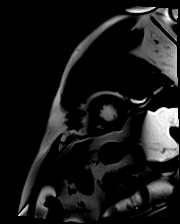

[Series 21: bSSFP · coronal · 8.0mm · 1.70mm/px · 1 of 25 slices shown (14 of 21)]
[im 1/25]
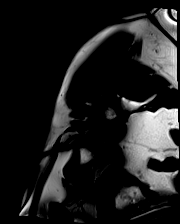

[Series 22: bSSFP · coronal · 8.0mm · 1.70mm/px · 1 of 25 slices shown (15 of 21)]
[im 1/25]
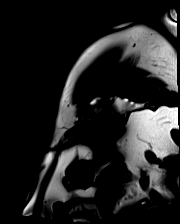

[Series 23: bSSFP · coronal · 8.0mm · 1.70mm/px · 1 of 25 slices shown (16 of 21)]
[im 1/25]
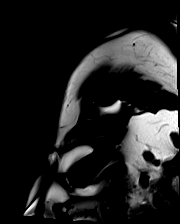

[Series 24: bSSFP · coronal · 8.0mm · 1.70mm/px · 1 of 25 slices shown (17 of 21)]
[im 1/25]
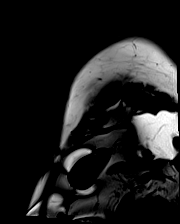

[Series 25: bSSFP · oblique · 6.0mm · 1.41mm/px · 1 of 25 slices shown (18 of 21)]
[im 1/25]
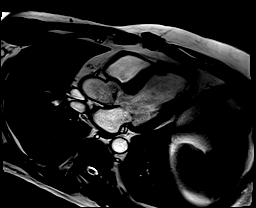

[Series 26: bSSFP · sagittal · 6.0mm · 1.41mm/px · 1 of 25 slices shown (19 of 21)]
[im 1/25]
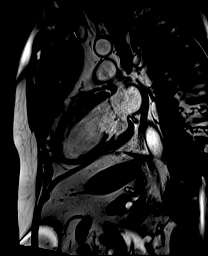

[Series 27: bSSFP · axial · 6.0mm · 1.41mm/px · 1 of 25 slices shown (20 of 21)]
[im 1/25]
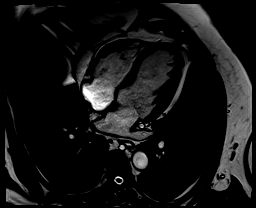

[Series 28: (id)_long_t1 · coronal · 8.0mm · 1.56mm/px · 1 of 24 slices shown]
[im 1/24]
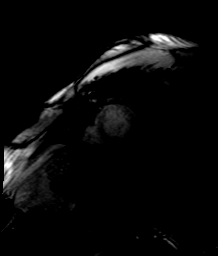

[Series 29: (id)_long_t1_moco · coronal · 8.0mm · 1.56mm/px · 1 of 24 slices shown]
[im 1/24]
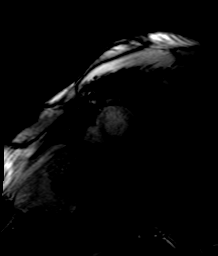

[Series 30: (id)_long_t1_moco_t1 · coronal · 8.0mm · 1.56mm/px · 1 of 6 slices shown]
[im 1/6]
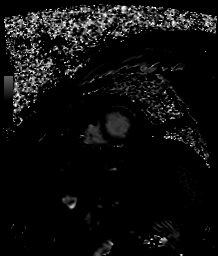

[Series 32: (id)_trufi · coronal · 8.0mm · 2.08mm/px · 1 of 9 slices shown]
[im 1/9]
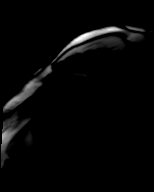

[Series 33: (id)_trufi_moco · coronal · 8.0mm · 2.08mm/px · 1 of 9 slices shown]
[im 1/9]
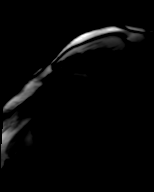

[Series 34: (id)_trufi_moco_t2 · coronal · 8.0mm · 2.08mm/px · 1 of 3 slices shown]
[im 1/3]
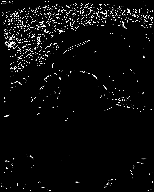

[Series 36: (person_name)_(person_name)_(person_name) · sagittal · 8.0mm · 1.79mm/px · 1 of 25 slices shown (1 of 5)]
[im 1/25]
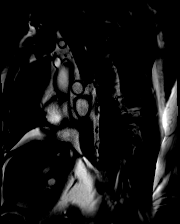

[Series 36: (person_name)_(person_name)_(person_name) · sagittal · 8.0mm · 1.79mm/px · 1 of 25 slices shown (2 of 5)]
[im 1/25]
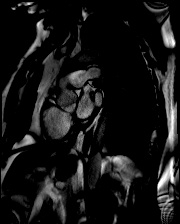

[Series 36: (person_name)_(person_name)_(person_name) · sagittal · 8.0mm · 1.79mm/px · 1 of 25 slices shown (3 of 5)]
[im 1/25]
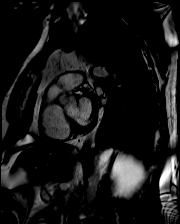

[Series 36: (person_name)_(person_name)_(person_name) · sagittal · 8.0mm · 1.79mm/px · 1 of 25 slices shown (4 of 5)]
[im 1/25]
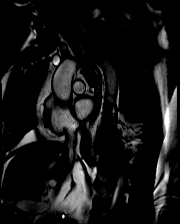

[Series 36: (person_name)_(person_name)_(person_name) · sagittal · 8.0mm · 1.79mm/px · 1 of 25 slices shown (5 of 5)]
[im 1/25]
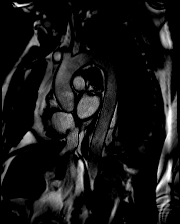

[Series 37: angio_fl3d_sag_pre · sagittal · 1.1mm · 1.17mm/px · 1 of 96 slices shown]
[im 1/96]
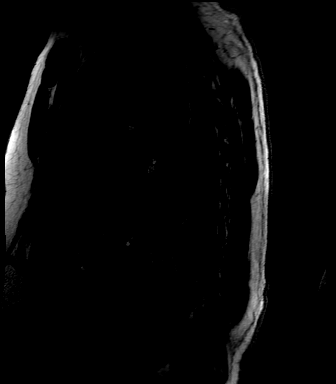

[Series 39: candy cane ce-arterial · sagittal · arterial · 1.1mm · 1.17mm/px · 1 of 96 slices shown]
[im 1/96]
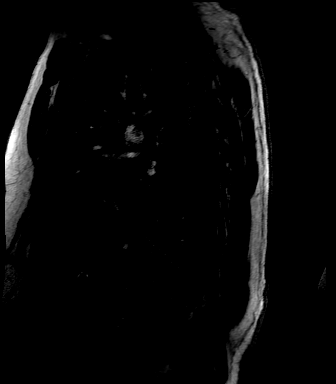

[Series 40: candy cane ce-arterial_sub · sagittal · 1.1mm · 1.17mm/px · 1 of 96 slices shown]
[im 1/96]
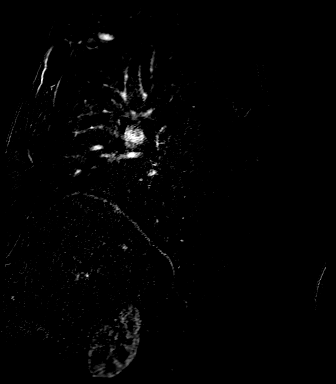

[Series 42: candy cane ce-venous · sagittal · portal-venous · 1.1mm · 1.17mm/px · 1 of 96 slices shown]
[im 1/96]
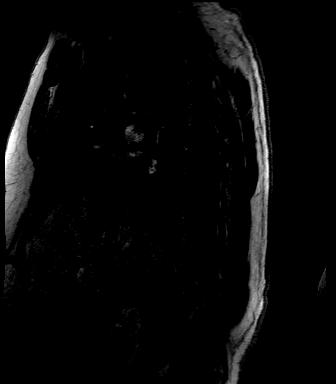

[Series 43: candy cane ce-venous_sub · sagittal · 1.1mm · 1.17mm/px · 1 of 96 slices shown]
[im 1/96]
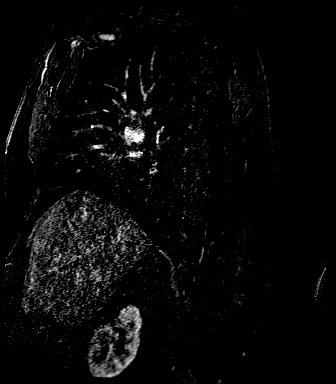

[Series 45: bSSFP · coronal · 6.0mm · 1.41mm/px · 1 of 25 slices shown (21 of 21)]
[im 1/25]
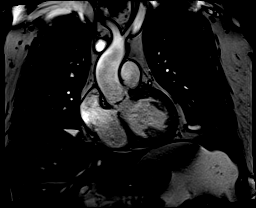

[Series 46: aortic valve cine · oblique · 6.0mm · 1.41mm/px · 1 of 25 slices shown]
[im 1/25]
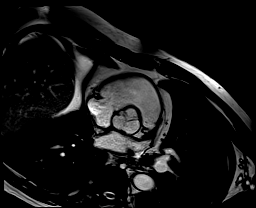

[Series 47: cine rvit · oblique · 6.0mm · 1.41mm/px · 1 of 25 slices shown]
[im 1/25]
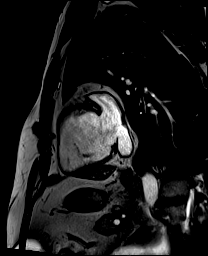

[Series 48: cine rvot · sagittal · 6.0mm · 1.41mm/px · 1 of 25 slices shown]
[im 1/25]
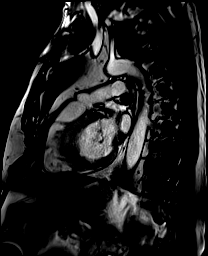

[Series 49: aortic valve flow_200_tp_retro_bh · axial · 6.0mm · 1.73mm/px · 1 of 30 slices shown]
[im 1/30]
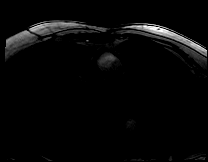

[Series 50: aortic valve flow_200_tp_retro_bh_mag · axial · 6.0mm · 1.73mm/px · 1 of 24 slices shown]
[im 1/24]
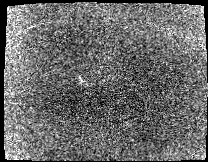

[Series 51: aortic valve flow_200_tp_retro_bh_p · axial · 6.0mm · 1.73mm/px · 1 of 30 slices shown]
[im 1/30]
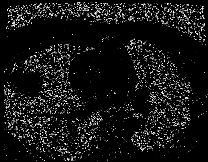

[44 of 48 positions shown; findings below may reference images not displayed]

FINDINGS: VASCULAR

Aorta: The aortic root is within normal limits at 4.1 cm as measured
at the sinuses of Valsalva. There is no effacement of the
KOYUMI junction. The tubular portion of the ascending thoracic
aorta is normal at 3.7 cm. No evidence of dissection. Variant branch
vessel anatomy. The brachiocephalic and left common carotid artery
share a common origin. The left vertebral artery arises directly
from the aorta.

Heart: Normal in size.  No focal abnormality.

Pulmonary Arteries:  Normal in size.  No pulmonary embolus.

Other: None.

NON-VASCULAR

Lungs/pleura: No focal signal abnormality or abnormal enhancement.

Mediastinum: No mediastinal mass or adenopathy.

Upper abdomen: No acute abnormality.

Musculoskeletal: No focal signal abnormality or abnormal
enhancement.
IMPRESSION: VASCULAR

1. Normal caliber thoracic aorta. The aortic root, ascending,
transverse and descending thoracic aorta are all normal in caliber.
2. The left vertebral artery arises directly from the aorta and
there is a common origin of the brachiocephalic and left common
carotid arteries.

NON-VASCULAR

1. No focal abnormality.

## 2021-01-22 MED ORDER — GADOBUTROL 1 MMOL/ML IV SOLN
10.0000 mL | Freq: Once | INTRAVENOUS | Status: AC | PRN
  Administered 2021-01-22: 10 mL via INTRAVENOUS

## 2021-01-26 ENCOUNTER — Other Ambulatory Visit (HOSPITAL_COMMUNITY): Payer: Medicare HMO

## 2021-01-27 ENCOUNTER — Encounter: Payer: Self-pay | Admitting: Cardiology

## 2021-02-16 ENCOUNTER — Telehealth: Payer: Self-pay

## 2021-02-16 MED ORDER — ATORVASTATIN CALCIUM 80 MG PO TABS: 80.0000 mg | ORAL_TABLET | Freq: Every day | ORAL | 3 refills | Status: DC

## 2021-02-16 NOTE — Telephone Encounter (Signed)
Received a call from the patient requesting a refill on his Atorvastatin. He states his dosage increased to 80mg  and a new rx was not sent in. He requested his refill be sent to [facility] mail order.  Reviewed chart.   Rx(s) sent to pharmacy electronically.

## 2021-11-18 ENCOUNTER — Other Ambulatory Visit: Payer: Self-pay | Admitting: Cardiology

## 2021-11-18 MED ORDER — ICOSAPENT ETHYL 1 G PO CAPS
2.0000 g | ORAL_CAPSULE | Freq: Two times a day (BID) | ORAL | 0 refills | Status: DC
Start: 2021-11-18 — End: 2022-03-01

## 2021-12-30 ENCOUNTER — Other Ambulatory Visit: Payer: Self-pay | Admitting: Cardiology

## 2022-03-01 ENCOUNTER — Other Ambulatory Visit: Payer: Self-pay | Admitting: Cardiology

## 2022-04-06 ENCOUNTER — Other Ambulatory Visit: Payer: Self-pay | Admitting: Cardiology

## 2022-04-06 ENCOUNTER — Ambulatory Visit: Payer: Medicare HMO | Admitting: Cardiology

## 2022-04-06 ENCOUNTER — Encounter: Payer: Self-pay | Admitting: Cardiology

## 2022-04-06 VITALS — BP 108/64 | HR 50 | Ht 71.0 in | Wt 231.8 lb

## 2022-04-06 DIAGNOSIS — N182 Chronic kidney disease, stage 2 (mild): Secondary | ICD-10-CM

## 2022-04-06 DIAGNOSIS — I6521 Occlusion and stenosis of right carotid artery: Secondary | ICD-10-CM

## 2022-04-06 DIAGNOSIS — I2584 Coronary atherosclerosis due to calcified coronary lesion: Secondary | ICD-10-CM

## 2022-04-06 DIAGNOSIS — E1169 Type 2 diabetes mellitus with other specified complication: Secondary | ICD-10-CM

## 2022-04-06 DIAGNOSIS — I10 Essential (primary) hypertension: Secondary | ICD-10-CM | POA: Diagnosis not present

## 2022-04-06 DIAGNOSIS — E1122 Type 2 diabetes mellitus with diabetic chronic kidney disease: Secondary | ICD-10-CM

## 2022-04-06 DIAGNOSIS — I2510 Atherosclerotic heart disease of native coronary artery without angina pectoris: Secondary | ICD-10-CM | POA: Diagnosis not present

## 2022-04-06 DIAGNOSIS — E782 Mixed hyperlipidemia: Secondary | ICD-10-CM

## 2022-04-06 DIAGNOSIS — R002 Palpitations: Secondary | ICD-10-CM

## 2022-04-06 DIAGNOSIS — I77810 Thoracic aortic ectasia: Secondary | ICD-10-CM

## 2022-04-06 DIAGNOSIS — I350 Nonrheumatic aortic (valve) stenosis: Secondary | ICD-10-CM

## 2022-04-06 DIAGNOSIS — I129 Hypertensive chronic kidney disease with stage 1 through stage 4 chronic kidney disease, or unspecified chronic kidney disease: Secondary | ICD-10-CM

## 2022-04-06 MED ORDER — EZETIMIBE 10 MG PO TABS: 10.0000 mg | ORAL_TABLET | Freq: Every day | ORAL | 3 refills | Status: DC

## 2022-04-06 MED ORDER — ATORVASTATIN CALCIUM 80 MG PO TABS: 80.0000 mg | ORAL_TABLET | Freq: Every day | ORAL | 3 refills | Status: DC

## 2022-04-06 MED ORDER — ICOSAPENT ETHYL 1 G PO CAPS: ORAL_CAPSULE | ORAL | 3 refills | Status: DC

## 2022-04-06 MED ORDER — LISINOPRIL 10 MG PO TABS: 10.0000 mg | ORAL_TABLET | Freq: Every day | ORAL | 3 refills | Status: DC

## 2022-04-06 NOTE — Progress Notes (Signed)
Cardiology Office Note:    Date:  04/06/2022   ID:  Russell Vincent, DOB 04/25/1951, MRN [id number]  PCP:  Heywood Bene, PA-C  Cardiologist:  Fransico Him, MD    Referring MD: Heywood Bene, *   Chief Complaint  Patient presents with   Coronary Artery Disease   Hyperlipidemia    History of Present Illness:    Russell Vincent is a 71 y.o. male with a hx of HTN, type 2 DM, mixed hyperlipidemia and family history of premature CAD.    He is here today for followup and is doing well.  He denies any chest pain or pressure, SOB, DOE, PND, orthopnea, LE edema, dizziness, palpitations or syncope. He is compliant with his meds and is tolerating meds with no SE.     Past Medical History:  Diagnosis Date   Aortic stenosis    mild AS by echo 12/2017 but no AS on MRI 01/2021.  Mild AI by MRI   Ascending aorta dilatation (HCC)    4.1cm by CT 01/2019 and 8mm by echo 12/2019.  4.1cm at the SOV and 3.8cm of ascending aorta 6/22   Benign essential HTN 11/11/2016   Carotid stenosis, asymptomatic, right    1-39% right carotid stenosis.  RIght subclavian artery stenosis by carotid dopplers 10/2020   Chronic kidney disease    Family history of premature CAD 11/11/2016   Hyperlipidemia 11/11/2016   Type 2 DM with CKD and hypertension     No past surgical history on file.  Current Medications: Current Meds  Medication Sig   aspirin EC 81 MG tablet Take 1 tablet (81 mg total) by mouth daily.   CINNAMON PO Take 2 capsules by mouth daily.   Coenzyme Q10 (CO Q 10 PO) Take 1 capsule by mouth daily.   icosapent Ethyl (VASCEPA) 1 g capsule Take 2 capsules by mouth 2 times per day.  Please call [phone] to schedule an overdue appointment for future refills. Thank you. 2nd attempt.   metFORMIN (GLUCOPHAGE-XR) 500 MG 24 hr tablet Take 500 mg by mouth 2 (two) times daily.   Multiple Vitamin (MULTIVITAMIN) tablet Take 1 tablet by mouth daily.   TURMERIC PO Take 2 tablets by mouth daily.    [DISCONTINUED] atorvastatin (LIPITOR) 80 MG tablet Take 1 tablet (80 mg total) by mouth daily. Please call [phone] to schedule an overdue appointment for future refills. Thank you. 2nd attempt.   [DISCONTINUED] lisinopril (ZESTRIL) 10 MG tablet TAKE 1 TABLET EVERY DAY     Allergies:   Patient has no known allergies.   Social History   Socioeconomic History   Marital status: Married    Spouse name: Not on file   Number of children: Not on file   Years of education: Not on file   Highest education level: Not on file  Occupational History   Not on file  Tobacco Use   Smoking status: Never   Smokeless tobacco: Never  Vaping Use   Vaping Use: Never used  Substance and Sexual Activity   Alcohol use: Yes    Comment: 2 beers a month   Drug use: No   Sexual activity: Yes  Other Topics Concern   Not on file  Social History Narrative   Not on file   Social Determinants of Health   Financial Resource Strain: Not on file  Food Insecurity: Not on file  Transportation Needs: Not on file  Physical Activity: Not on file  Stress: Not on file  Social Connections: Not on file     Family History: The patient's family history includes Heart disease in his father and mother.  ROS:   Please see the history of present illness.    ROS  All other systems reviewed and negative.   EKGs/Labs/Other Studies Reviewed:    The following studies were reviewed today: none  EKG:  EKG is  ordered today.  The ekg ordered today demonstrates sinus bradycardia with LVH by repol abnormality  Recent Labs: No results found for requested labs within last 365 days.   Recent Lipid Panel    Component Value Date/Time   CHOL 161 01/02/2021 1114   TRIG 211 (H) 01/02/2021 1114   HDL 48 01/02/2021 1114   CHOLHDL 3.4 01/02/2021 1114   LDLCALC 78 01/02/2021 1114    Physical Exam:    VS:  BP 108/64   Pulse (!) 50   Ht 5\' 11"  (1.803 m)   Wt 231 lb 12.8 oz (105.1 kg)   SpO2 97%   BMI 32.33 kg/m      Wt Readings from Last 3 Encounters:  04/06/22 231 lb 12.8 oz (105.1 kg)  01/02/21 252 lb (114.3 kg)  10/24/19 253 lb 6.4 oz (114.9 kg)     GEN: Well nourished, well developed in no acute distress HEENT: Normal NECK: No JVD; No carotid bruits LYMPHATICS: No lymphadenopathy CARDIAC:RRR, no murmurs, rubs, gallops RESPIRATORY:  Clear to auscultation without rales, wheezing or rhonchi  ABDOMEN: Soft, non-tender, non-distended MUSCULOSKELETAL:  No edema; No deformity  SKIN: Warm and dry NEUROLOGIC:  Alert and oriented x 3 PSYCHIATRIC:  Normal affect   ASSESSMENT:    1. Dilated aortic root (HCC)   2. Benign essential HTN   3. Coronary artery calcification   4. Mixed diabetic hyperlipidemia associated with type 2 diabetes mellitus (HCC)   5. 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 Nonrheumatic aortic valve stenosis   7. Carotid stenosis, asymptomatic, right   8. Palpitations    PLAN:    In order of problems listed above:  1.  Dilated aortic root -Chest CTA 01/2019 showed sinus of valsalva dimensions of 4.1cm.   -echo 01/2020 reviewed and showed aortic root of 60mm -Chest MRI/MRA 02/09/2021 showed normal aortic root and ascending aortic dimensions  2.  HTN -BP is adequately controlled on exam today -continue drug management with Lisinopril 10mg  daily with PRN refills -I have personally reviewed and interpreted outside labs performed by patient's PCP which showed serum creatinine 1.1 and potassium 4.8 on 01/02/2021  3.  Coronary artery calcifications on chest CT -Calcium scoring on 11/26/2016 was elevated at 244.   -Nuclear stress test 2018 showed no ischemia -He did denies any anginal symptoms -Continue aspirin 81 mg daily and statin therapy  4.  HLD -LDL goal < 70 -I have personally reviewed and interpreted outside labs performed by patient's PCP which showed LDL 98 and HDL 50 on 12/18/2021  -Continue per drug management with atorvastatin 80 mg daily and  Vascepa 2gm BID with as needed refills -Add Zetia 10mg  daily and repeat FLP and ALT in 6 weeks  5.  Type 2 Dm -followed by PCP -started on Metformin  6.  Aortic stenosis -mild by echo 2019 but none on echo 2021  7.  Carotid artery stenosis 1-39% right carotid stenosis and right subclavian artery stenosis -He has not had any arm weakness or claudication -continue statin -repeat carotid dopplers 10/2022  Followup with me in 1  year  Medication Adjustments/Labs and Tests Ordered: Current medicines are reviewed at length with the patient today.  Concerns regarding medicines are outlined above.  Orders Placed This Encounter  Procedures   EKG 12-Lead   Meds ordered this encounter  Medications   atorvastatin (LIPITOR) 80 MG tablet    Sig: Take 1 tablet (80 mg total) by mouth daily. Please call [phone] to schedule an overdue appointment for future refills. Thank you. 2nd attempt.    Dispense:  90 tablet    Refill:  3   lisinopril (ZESTRIL) 10 MG tablet    Sig: Take 1 tablet (10 mg total) by mouth daily.    Dispense:  90 tablet    Refill:  3    Please call our office to schedule an yearly appointment with Dr. Mayford Knife for May 2023 before anymore refills. [phone]. Thank you 1st attempt    Signed, Armanda Magic, MD  04/06/2022 10:16 AM    [facility]

## 2022-04-06 NOTE — Addendum Note (Signed)
Addended by: Theresia Majors on: 04/06/2022 10:20 AM   Modules accepted: Orders

## 2022-04-06 NOTE — Patient Instructions (Signed)
Medication Instructions:  Your physician has recommended you make the following change in your medication: 1)  START taking Zetia (ezetimibe) 10 mg daily  *If you need a refill on your cardiac medications before your next appointment, please call your pharmacy*   Lab Work: IN 6-8 WEEKS: Fasting lipids and ALT If you have labs (blood work) drawn today and your tests are completely normal, you will receive your results only by: MyChart Message (if you have MyChart) OR A paper copy in the mail If you have any lab test that is abnormal or we need to change your treatment, we will call you to review the results.   Testing/Procedures: Your physician has requested that you have a carotid duplex in March 2024. This test is an ultrasound of the carotid arteries in your neck. It looks at blood flow through these arteries that supply the brain with blood. Allow one hour for this exam. There are no restrictions or special instructions.  Follow-Up: At [facility], you and your health needs are our priority.  As part of our continuing mission to provide you with exceptional heart care, we have created designated Provider Care Teams.  These Care Teams include your primary Cardiologist (physician) and Advanced Practice Providers (APPs -  Physician Assistants and Nurse Practitioners) who all work together to provide you with the care you need, when you need it.  Your next appointment:   1 year(s)  The format for your next appointment:   In Person  Provider:   Armanda Magic, MD     Important Information About Sugar

## 2022-08-02 ENCOUNTER — Telehealth: Payer: Self-pay | Admitting: Pharmacist

## 2022-08-02 NOTE — Telephone Encounter (Signed)
Received fax from [facility] that Russell Vincent is available without prior authorization.

## 2022-11-02 ENCOUNTER — Ambulatory Visit (HOSPITAL_COMMUNITY)
Admission: RE | Admit: 2022-11-02 | Discharge: 2022-11-02 | Disposition: A | Discharge status: Home / Self Care | Payer: Medicare HMO | Source: Ambulatory Visit | Attending: Cardiology | Admitting: Cardiology

## 2022-11-02 DIAGNOSIS — I350 Nonrheumatic aortic (valve) stenosis: Secondary | ICD-10-CM | POA: Insufficient documentation

## 2022-11-02 DIAGNOSIS — R002 Palpitations: Secondary | ICD-10-CM

## 2022-11-02 DIAGNOSIS — N182 Chronic kidney disease, stage 2 (mild): Secondary | ICD-10-CM | POA: Diagnosis present

## 2022-11-02 DIAGNOSIS — I77810 Thoracic aortic ectasia: Secondary | ICD-10-CM | POA: Diagnosis not present

## 2022-11-02 DIAGNOSIS — I2510 Atherosclerotic heart disease of native coronary artery without angina pectoris: Secondary | ICD-10-CM | POA: Diagnosis not present

## 2022-11-02 DIAGNOSIS — I10 Essential (primary) hypertension: Secondary | ICD-10-CM | POA: Diagnosis not present

## 2022-11-02 DIAGNOSIS — I2584 Coronary atherosclerosis due to calcified coronary lesion: Secondary | ICD-10-CM | POA: Insufficient documentation

## 2022-11-02 DIAGNOSIS — E1122 Type 2 diabetes mellitus with diabetic chronic kidney disease: Secondary | ICD-10-CM | POA: Diagnosis present

## 2022-11-02 DIAGNOSIS — E782 Mixed hyperlipidemia: Secondary | ICD-10-CM | POA: Diagnosis present

## 2022-11-02 DIAGNOSIS — I129 Hypertensive chronic kidney disease with stage 1 through stage 4 chronic kidney disease, or unspecified chronic kidney disease: Secondary | ICD-10-CM

## 2022-11-02 DIAGNOSIS — E1169 Type 2 diabetes mellitus with other specified complication: Secondary | ICD-10-CM | POA: Diagnosis present

## 2022-11-02 DIAGNOSIS — I6521 Occlusion and stenosis of right carotid artery: Secondary | ICD-10-CM | POA: Diagnosis not present

## 2022-11-03 ENCOUNTER — Telehealth: Payer: Self-pay | Admitting: Cardiology

## 2022-11-03 NOTE — Telephone Encounter (Signed)
Patient is returning call in regards to results. Requesting return call.  

## 2022-11-03 NOTE — Telephone Encounter (Signed)
Patient returning call, transferred by operator.  Discussed carotid US results with patient, per Dr. Johney Frame: Carotids show mild disease. Will continue is current medications and plan to repeat scan in 2-3 years for monitoring   Patient verbalized understanding and expressed appreciation for follow-up.

## 2022-11-03 NOTE — Telephone Encounter (Signed)
Patient is returning call. Transferred to [employer], Therapist, sports.

## 2022-11-03 NOTE — Telephone Encounter (Signed)
Left message for patient to call back  

## 2023-02-07 ENCOUNTER — Other Ambulatory Visit: Payer: Self-pay | Admitting: Cardiology

## 2023-03-28 ENCOUNTER — Other Ambulatory Visit: Payer: Self-pay | Admitting: Cardiology

## 2023-03-29 MED ORDER — ICOSAPENT ETHYL 1 G PO CAPS: ORAL_CAPSULE | ORAL | 0 refills | Status: DC

## 2023-04-27 ENCOUNTER — Other Ambulatory Visit: Payer: Self-pay | Admitting: Cardiology

## 2023-05-05 ENCOUNTER — Other Ambulatory Visit: Payer: Self-pay | Admitting: Cardiology

## 2023-05-11 ENCOUNTER — Other Ambulatory Visit: Payer: Self-pay | Admitting: Cardiology

## 2023-05-16 ENCOUNTER — Other Ambulatory Visit: Payer: Self-pay | Admitting: Cardiology

## 2023-05-20 ENCOUNTER — Other Ambulatory Visit: Payer: Self-pay

## 2023-05-20 MED ORDER — ATORVASTATIN CALCIUM 80 MG PO TABS: 80.0000 mg | ORAL_TABLET | Freq: Every day | ORAL | 0 refills | Status: DC

## 2023-05-20 MED ORDER — LISINOPRIL 10 MG PO TABS: 10.0000 mg | ORAL_TABLET | Freq: Every day | ORAL | 0 refills | Status: DC

## 2023-05-31 ENCOUNTER — Other Ambulatory Visit: Payer: Self-pay | Admitting: Cardiology

## 2023-06-12 ENCOUNTER — Other Ambulatory Visit: Payer: Self-pay | Admitting: Cardiology

## 2023-07-22 ENCOUNTER — Other Ambulatory Visit: Payer: Self-pay | Admitting: Cardiology

## 2023-08-31 ENCOUNTER — Other Ambulatory Visit: Payer: Self-pay | Admitting: Cardiology

## 2023-09-08 ENCOUNTER — Ambulatory Visit: Payer: Medicare HMO | Attending: Cardiology | Admitting: Cardiology

## 2023-09-08 ENCOUNTER — Encounter: Payer: Self-pay | Admitting: Cardiology

## 2023-09-08 VITALS — BP 110/62 | HR 60 | Ht 71.0 in | Wt 250.0 lb

## 2023-09-08 DIAGNOSIS — I350 Nonrheumatic aortic (valve) stenosis: Secondary | ICD-10-CM

## 2023-09-08 DIAGNOSIS — E1169 Type 2 diabetes mellitus with other specified complication: Secondary | ICD-10-CM

## 2023-09-08 DIAGNOSIS — I77810 Thoracic aortic ectasia: Secondary | ICD-10-CM | POA: Diagnosis not present

## 2023-09-08 DIAGNOSIS — I2510 Atherosclerotic heart disease of native coronary artery without angina pectoris: Secondary | ICD-10-CM | POA: Diagnosis not present

## 2023-09-08 DIAGNOSIS — I10 Essential (primary) hypertension: Secondary | ICD-10-CM | POA: Diagnosis not present

## 2023-09-08 DIAGNOSIS — E782 Mixed hyperlipidemia: Secondary | ICD-10-CM

## 2023-09-08 DIAGNOSIS — I6521 Occlusion and stenosis of right carotid artery: Secondary | ICD-10-CM

## 2023-09-08 MED ORDER — ATORVASTATIN CALCIUM 40 MG PO TABS: 40.0000 mg | ORAL_TABLET | Freq: Every day | ORAL | 3 refills | Status: DC

## 2023-09-08 NOTE — Patient Instructions (Addendum)
Medication Instructions:  Your physician has recommended you make the following change in your medication:  - decreased Atorvastatin to 40 mg one tablet daily  - Stop Lisinopril    *If you need a refill on your cardiac medications before your next appointment, please call your pharmacy*  Lab Work: Your physician recommends that you return for lab work in: 8 weeks for fasting Lipid and Liver Panel at [facility]    If you have labs (blood work) drawn today and your tests are completely normal, you will receive your results only by: MyChart Message (if you have MyChart) OR A paper copy in the mail If you have any lab test that is abnormal or we need to change your treatment, we will call you to review the results.  Testing/Procedures: Your physician has requested that you have an echocardiogram. Echocardiography is a painless test that uses sound waves to create images of your heart. It provides your doctor with information about the size and shape of your heart and how well your heart's chambers and valves are working. This procedure takes approximately one hour. There are no restrictions for this procedure. Please do NOT wear cologne, perfume, aftershave, or lotions (deodorant is allowed). Please arrive 15 minutes prior to your appointment time.  Please note: We ask at that you not bring children with you during ultrasound (echo/ vascular) testing. Due to room size and safety concerns, children are not allowed in the ultrasound rooms during exams. Our front office staff cannot provide observation of children in our lobby area while testing is being conducted. An adult accompanying a patient to their appointment will only be allowed in the ultrasound room at the discretion of the ultrasound technician under special circumstances. We apologize for any inconvenience.   Your physician has requested that you have a carotid duplex. March 2026 This test is an ultrasound of the carotid arteries in your  neck. It looks at blood flow through these arteries that supply the brain with blood. Allow one hour for this exam. There are no restrictions or special instructions.   Follow-Up: At [facility], you and your health needs are our priority.  As part of our continuing mission to provide you with exceptional heart care, we have created designated Provider Care Teams.  These Care Teams include your primary Cardiologist (physician) and Advanced Practice Providers (APPs -  Physician Assistants and Nurse Practitioners) who all work together to provide you with the care you need, when you need it.  We recommend signing up for the patient portal called "MyChart".  Sign up information is provided on this After Visit Summary.  MyChart is used to connect with patients for Virtual Visits (Telemedicine).  Patients are able to view lab/test results, encounter notes, upcoming appointments, etc.  Non-urgent messages can be sent to your provider as well.   To learn more about what you can do with MyChart, go to [email or web address].    Your next appointment:   1 year(s)  Provider:   Armanda Magic, MD     Other Instructions  Check blood pressure twice a day for 1 week and call with results.   Your physician has requested that you regularly monitor and record your blood pressure readings at home. Please use the same machine at the same time of day to check your readings and record them to bring to your follow-up visit.

## 2023-09-08 NOTE — Progress Notes (Signed)
Cardiology Office Note:    Date:  09/08/2023   ID:  Russell Vincent, DOB 1951/02/29, MRN [id number]  PCP:  Roger Kill, PA-C  Cardiologist:  Armanda Magic, MD    Referring MD: Roger Kill, *   Chief Complaint  Patient presents with   Coronary Artery Disease   Hypertension   Hyperlipidemia    History of Present Illness:    Russell Vincent is a 73 y.o. male with a hx of HTN, type 2 DM, mixed hyperlipidemia and family history of premature CAD.    He is here today for followup and is doing well.  He denies any chest pain or pressure, SOB, DOE, PND, orthopnea, LE edema, dizziness, palpitations or syncope. He is compliant with his meds and is tolerating meds with no SE.    Past Medical History:  Diagnosis Date   Aortic stenosis    mild AS by echo 12/2017 but no AS on MRI 01/2021.  Mild AI by MRI   Ascending aorta dilatation (HCC)    4.1cm by CT 01/2019 and 38mm by echo 12/2019.  4.1cm at the SOV and 3.8cm of ascending aorta 6/22   Benign essential HTN 11/11/2016   Carotid stenosis, asymptomatic, right    1-39% right carotid stenosis.  RIght subclavian artery stenosis by carotid dopplers 10/2020   Chronic kidney disease    Family history of premature CAD 11/11/2016   Hyperlipidemia 11/11/2016   Type 2 DM with CKD and hypertension     History reviewed. No pertinent surgical history.  Current Medications: Current Meds  Medication Sig   aspirin EC 81 MG tablet Take 1 tablet (81 mg total) by mouth daily.   atorvastatin (LIPITOR) 80 MG tablet TAKE 1 TABLET (80 MG TOTAL) BY MOUTH DAILY.   CINNAMON PO Take 2 capsules by mouth daily.   Coenzyme Q10 (CO Q 10 PO) Take 1 capsule by mouth daily.   ezetimibe (ZETIA) 10 MG tablet TAKE 1 TABLET EVERY DAY. PLEASE SCHEDULE OFFICE VISIT FOR FURTHER REFILLS.   icosapent Ethyl (VASCEPA) 1 g capsule TAKE 1 CAPSULE TWICE DAILY (MUST KEEP APPOINTMENT IN JANUARY 2025 WITH DR. Mayford Knife FOR FUTURE REFILLS)   lisinopril (ZESTRIL) 10 MG  tablet Take 1 tablet (10 mg total) by mouth daily.   metFORMIN (GLUCOPHAGE-XR) 500 MG 24 hr tablet Take 500 mg by mouth 2 (two) times daily.   Multiple Vitamin (MULTIVITAMIN) tablet Take 1 tablet by mouth daily.   tamsulosin (FLOMAX) 0.4 MG CAPS capsule Take 1 capsule by mouth daily.   TURMERIC PO Take 2 tablets by mouth daily.     Allergies:   Patient has no known allergies.   Social History   Socioeconomic History   Marital status: Married    Spouse name: Not on file   Number of children: Not on file   Years of education: Not on file   Highest education level: Not on file  Occupational History   Not on file  Tobacco Use   Smoking status: Never   Smokeless tobacco: Never  Vaping Use   Vaping status: Never Used  Substance and Sexual Activity   Alcohol use: Yes    Comment: 2 beers a month   Drug use: No   Sexual activity: Yes  Other Topics Concern   Not on file  Social History Narrative   Not on file   Social Drivers of Health   Financial Resource Strain: Not on file  Food Insecurity: Low Risk  (08/24/2023)  Received from [facility]   Hunger Vital Sign    Worried About Running Out of Food in the Last Year: Never true    Ran Out of Food in the Last Year: Never true  Transportation Needs: No Transportation Needs (08/24/2023)   Received from [facility]    In the past 12 months, has lack of reliable transportation kept you from medical appointments, meetings, work or from getting things needed for daily living? : No  Physical Activity: Not on file  Stress: Not on file  Social Connections: Unknown (01/05/2022)   Received from [facility]   Social Network    Social Network: Not on file     Family History: The patient's family history includes Heart disease in his father and mother.  ROS:   Please see the history of present illness.    ROS  All other systems reviewed and negative.   EKGs/Labs/Other Studies Reviewed:    The  following studies were reviewed today: none  Recent Labs: No results found for requested labs within last 365 days.   Recent Lipid Panel    Component Value Date/Time   CHOL 161 01/02/2021 1114   TRIG 211 (H) 01/02/2021 1114   HDL 48 01/02/2021 1114   CHOLHDL 3.4 01/02/2021 1114   LDLCALC 78 01/02/2021 1114    Physical Exam:    VS:  BP 110/62   Pulse 60   Ht 5\' 11"  (1.803 m)   Wt 250 lb (113.4 kg)   SpO2 99%   BMI 34.87 kg/m     Wt Readings from Last 3 Encounters:  09/08/23 250 lb (113.4 kg)  04/06/22 231 lb 12.8 oz (105.1 kg)  01/02/21 252 lb (114.3 kg)    GEN: Well nourished, well developed in no acute distress HEENT: Normal NECK: No JVD; No carotid bruits LYMPHATICS: No lymphadenopathy CARDIAC:RRR, no  rubs, gallops  2/6 SM at RUSB RESPIRATORY:  Clear to auscultation without rales, wheezing or rhonchi  ABDOMEN: Soft, non-tender, non-distended MUSCULOSKELETAL:  No edema; No deformity  SKIN: Warm and dry NEUROLOGIC:  Alert and oriented x 3 PSYCHIATRIC:  Normal affect  ASSESSMENT:    1. Dilated aortic root (HCC)   2. Benign essential HTN   3. Coronary artery calcification   4. Mixed diabetic hyperlipidemia associated with type 2 diabetes mellitus (HCC)   5. Nonrheumatic aortic valve stenosis   6. Carotid stenosis, asymptomatic, right     PLAN:    In order of problems listed above:  1.  Dilated aortic root -Chest CTA 01/2019 showed sinus of valsalva dimensions of 4.1cm.   -echo 01/2020 reviewed and showed aortic root of 38mm -Chest MRI/MRA 02/09/2021 showed normal aortic root and ascending aortic dimensions  2.  HTN -BP controlled on exam today His BP has really dropped since starting flomax and he has had some dizzy spells on the [place] -stop Lisinopril  -I have asked him to check his BP twice daily for a week and call with results  3.  Coronary artery calcifications on chest CT -Calcium scoring on 11/26/2016 was elevated at 244.   -Nuclear stress  test 2018 showed no ischemia -He has not had any anginal symptoms since he saw me last -Continue prescription drug management with aspirin 81 mg daily, atorvastatin 80 mg daily, Zetia 10 mg daily, Vascepa 1 g twice daily with as needed refills  4.  HLD -LDL goal < 70 -I have personally reviewed and interpreted outside labs performed  by patient's PCP which showed LDL 23, HDL 44 and TAGs 104 -Continue prescription drug management with Zetia 10 mg daily, Vascepa 2 g twice daily -decrease Atorvastatin to 40mg  daily due to low LDL -repeat FLP and ALT in 8 weeks  5.  Aortic stenosis -mild by echo 2019 but none on echo 2021 -repeat 2D echo since he has a SM  7.  Carotid artery stenosis -1-39% right carotid stenosis and right subclavian artery stenosis -Denies any arm weakness or claudication symptoms since I last saw him -Continue statin therapy -Repeat Dopplers 10/2024  Followup with me in 1 year  Medication Adjustments/Labs and Tests Ordered: Current medicines are reviewed at length with the patient today.  Concerns regarding medicines are outlined above.  Orders Placed This Encounter  Procedures   EKG 12-Lead   No orders of the defined types were placed in this encounter.   Signed, Armanda Magic, MD  09/08/2023 8:37 AM    [facility]

## 2023-09-13 ENCOUNTER — Other Ambulatory Visit: Payer: Self-pay | Admitting: Cardiology

## 2023-09-26 ENCOUNTER — Telehealth: Payer: Self-pay | Admitting: Cardiology

## 2023-09-26 NOTE — Telephone Encounter (Signed)
Pt c/o medication issue:  1. Name of Medication:  icosapent Ethyl (VASCEPA) 1 g capsule  2. How are you currently taking this medication (dosage and times per day)?   3. Are you having a reaction (difficulty breathing--STAT)?   4. What is your medication issue?   Patient would like to know if he should still be taking this medication. Please advise.

## 2023-09-30 ENCOUNTER — Ambulatory Visit (HOSPITAL_COMMUNITY): Payer: Medicare HMO | Attending: Cardiology

## 2023-09-30 ENCOUNTER — Telehealth: Payer: Self-pay | Admitting: Cardiology

## 2023-09-30 DIAGNOSIS — I77810 Thoracic aortic ectasia: Secondary | ICD-10-CM | POA: Diagnosis present

## 2023-09-30 DIAGNOSIS — I2510 Atherosclerotic heart disease of native coronary artery without angina pectoris: Secondary | ICD-10-CM | POA: Insufficient documentation

## 2023-09-30 DIAGNOSIS — I350 Nonrheumatic aortic (valve) stenosis: Secondary | ICD-10-CM | POA: Diagnosis present

## 2023-09-30 DIAGNOSIS — I10 Essential (primary) hypertension: Secondary | ICD-10-CM | POA: Diagnosis not present

## 2023-09-30 DIAGNOSIS — I6521 Occlusion and stenosis of right carotid artery: Secondary | ICD-10-CM | POA: Insufficient documentation

## 2023-09-30 DIAGNOSIS — E1169 Type 2 diabetes mellitus with other specified complication: Secondary | ICD-10-CM | POA: Insufficient documentation

## 2023-09-30 DIAGNOSIS — E782 Mixed hyperlipidemia: Secondary | ICD-10-CM | POA: Insufficient documentation

## 2023-09-30 LAB — ECHOCARDIOGRAM COMPLETE
AR max vel: 1.71 cm2
AV Area VTI: 1.63 cm2
AV Area mean vel: 1.45 cm2
AV Mean grad: 24 mm[Hg]
AV Peak grad: 39.2 mm[Hg]
Ao pk vel: 3.13 m/s
Area-P 1/2: 3.21 cm2
S' Lateral: 2.4 cm

## 2023-09-30 NOTE — Telephone Encounter (Signed)
 Call to patient who explains his insurance is needing a prior auth to continue to refill his vascepa . Forwarded to Prior [employer].

## 2023-09-30 NOTE — Telephone Encounter (Signed)
 Patient dropped off the following BP's along with a note explaining that he bought a new BP machine halfway through recording his blood pressure as he was suspicious that his old machine was inaccurate. He reports that the BP readings from 09/09/23 through 09/16/23 were measured on his old machine, and everything after that was measured on his new machine. The readings on his new machine are significantly lower. Forwarded to Dr. Shlomo for review.  Readings on Old Machine:  09/09/23 9:30 AM 137/74 HR 48 09/09/23 9:30 PM 151/78 HR 51  09/10/23 9:30 AM 128/77 HR 54 09/10/23 11:00 PM 128/67 HR53  09/11/23 10:45 PM 137/74 HR 57  09/12/23 10:00 AM 132/72 HR 52  09/13/23 1 PM 137/74 HR 52 09/13/23 10 PM 162/76 HR 51  09/14/23 10:30 AM 137/74 HR 52 09/14/23 10:25 PM 147/71 HR 54  09/15/23 9 AM 131/74 HR 49 09/15/23 9:30 PM 156/74 HR 57  09/16/23 9:25 AM 138/74 HR 47 09/16/23 9:25 PM 140/74 HR 52  Readings on New Machine:  09/27/23: 9:30PM 131/68 HR 52  09/27/22 9:30 AM 117/68 HR 54 09/28/23 9:30 PM 131/61 HR57  09/29/23 9:20 AM 125/71 HR 48 09/29/23 9:20 PM 132/69 HR 52  09/30/23 9:20 AM 129/68 HR 51

## 2023-09-30 NOTE — Telephone Encounter (Signed)
 Dropped off BP readings Front desk but will put in mailbox 09-30-23

## 2023-10-03 ENCOUNTER — Telehealth: Payer: Self-pay | Admitting: Pharmacy Technician

## 2023-10-03 ENCOUNTER — Encounter: Payer: Self-pay | Admitting: Cardiology

## 2023-10-03 ENCOUNTER — Other Ambulatory Visit (HOSPITAL_COMMUNITY): Payer: Self-pay

## 2023-10-03 NOTE — Telephone Encounter (Signed)
 Pharmacy Patient Advocate Encounter   Received notification from Pt Calls Messages that prior authorization for Vascepa  is required/requested.   Insurance verification completed.   The patient is insured through [employer] .   Per test claim: PA required; PA submitted to above mentioned insurance via CoverMyMeds Key/confirmation #/EOC [id number] Status is pending

## 2023-10-03 NOTE — Telephone Encounter (Signed)
 per test claim: Refill too soon. PA is not needed at this time. Medication was filled 09/20/23. Next eligible fill date is 10/13/23.   Per insurance: The drugs on [employer]'s drug list include: Vascepa  capsule.

## 2023-10-03 NOTE — Telephone Encounter (Signed)
 Call to patient to discuss Dr. Charl Concha review of BP log, no answer. Left detailed message per DPR explaining Dr. Micael Adas is happy with readings from his new machine. Advised patient to continue to check BP daily, and to check if he notices any symptoms such as headache, fatigue or lightheadedness. Advised patient to call our office if any questions.

## 2023-10-06 ENCOUNTER — Other Ambulatory Visit: Payer: Self-pay

## 2023-10-06 DIAGNOSIS — I350 Nonrheumatic aortic (valve) stenosis: Secondary | ICD-10-CM

## 2023-10-07 NOTE — Telephone Encounter (Signed)
Call to patient to attempt to verify if he was able to pick up his vascepa, no answer. Left detailed message explaining that our records show he was able to refill on 09/20/23 and will be due again on 10/13/23. Asked patient to call us if this is not the case and he is still having difficulty obtaining his medication.

## 2023-10-17 ENCOUNTER — Other Ambulatory Visit: Payer: Self-pay | Admitting: Cardiology

## 2024-07-17 ENCOUNTER — Other Ambulatory Visit: Payer: Self-pay | Admitting: Cardiology

## 2024-08-01 ENCOUNTER — Other Ambulatory Visit: Payer: Self-pay | Admitting: Cardiology

## 2024-08-20 ENCOUNTER — Other Ambulatory Visit: Payer: Self-pay | Admitting: Cardiology

## 2024-08-30 ENCOUNTER — Ambulatory Visit (HOSPITAL_COMMUNITY)
Admission: RE | Admit: 2024-08-30 | Discharge: 2024-08-30 | Disposition: A | Discharge status: Home / Self Care | Source: Ambulatory Visit | Attending: Cardiology | Admitting: Cardiology

## 2024-08-30 ENCOUNTER — Ambulatory Visit (HOSPITAL_COMMUNITY)
Admission: RE | Admit: 2024-08-30 | Discharge: 2024-08-30 | Disposition: A | Discharge status: Home / Self Care | Source: Ambulatory Visit | Attending: Cardiovascular Disease | Admitting: Cardiovascular Disease

## 2024-08-30 DIAGNOSIS — I350 Nonrheumatic aortic (valve) stenosis: Secondary | ICD-10-CM | POA: Diagnosis present

## 2024-08-30 DIAGNOSIS — I6521 Occlusion and stenosis of right carotid artery: Secondary | ICD-10-CM | POA: Diagnosis present

## 2024-08-30 DIAGNOSIS — I10 Essential (primary) hypertension: Secondary | ICD-10-CM

## 2024-08-30 DIAGNOSIS — I5030 Unspecified diastolic (congestive) heart failure: Secondary | ICD-10-CM

## 2024-08-30 DIAGNOSIS — I517 Cardiomegaly: Secondary | ICD-10-CM | POA: Diagnosis not present

## 2024-08-30 DIAGNOSIS — I77810 Thoracic aortic ectasia: Secondary | ICD-10-CM | POA: Diagnosis present

## 2024-08-30 DIAGNOSIS — I2510 Atherosclerotic heart disease of native coronary artery without angina pectoris: Secondary | ICD-10-CM | POA: Diagnosis present

## 2024-08-30 DIAGNOSIS — E1169 Type 2 diabetes mellitus with other specified complication: Secondary | ICD-10-CM

## 2024-08-30 DIAGNOSIS — E782 Mixed hyperlipidemia: Secondary | ICD-10-CM

## 2024-08-30 LAB — ECHOCARDIOGRAM COMPLETE
AR max vel: 1.27 cm2
AV Area VTI: 1.27 cm2
AV Area mean vel: 1.24 cm2
AV Mean grad: 20.8 mmHg
AV Peak grad: 38.7 mmHg
Ao pk vel: 3.11 m/s
Area-P 1/2: 2.69 cm2
S' Lateral: 2.4 cm

## 2024-08-31 ENCOUNTER — Ambulatory Visit: Payer: Self-pay | Admitting: Cardiology

## 2024-08-31 ENCOUNTER — Encounter: Payer: Self-pay | Admitting: Cardiology

## 2024-08-31 DIAGNOSIS — I350 Nonrheumatic aortic (valve) stenosis: Secondary | ICD-10-CM

## 2024-09-02 ENCOUNTER — Encounter: Payer: Self-pay | Admitting: Cardiology

## 2024-09-02 ENCOUNTER — Ambulatory Visit: Payer: Self-pay | Admitting: Cardiology

## 2024-09-03 NOTE — Telephone Encounter (Signed)
-----   Message from Wilbert Bihari, MD sent at 08/31/2024 11:45 AM EST ----- 2D echo showed normal pumping function of the heart muscle with EF 60 to 65% with mildly thickened heart muscle and increase stiffness of the heart muscle called diastolic dysfunction, normal RV,  trivial leakiness of the mitral valve, calcified aortic valve with moderate aortic valve stenosis and mildly dilated aortic root at 40 mm and ascending aorta 38 mm.  Compared to echo 1 year ago  stiffness of the heart muscle has improved and no change in degree of aortic stenosis.  Repeat 2D echo in 1 year for aortic stenosis

## 2024-09-03 NOTE — Telephone Encounter (Signed)
-----   Message from Wilbert Bihari, MD sent at 09/02/2024  7:23 PM EST ----- Carotid dopplers showed 1-39% bilateral stenosis - possible flow disturbance in the right subclavian without stenosis noted. Repeat dopplers in 1 year

## 2024-09-03 NOTE — Telephone Encounter (Signed)
 Call to patient to explain: 2D echo showed normal pumping function of the heart muscle with EF 60 to 65% with mildly thickened heart muscle and increase stiffness of the heart muscle called diastolic dysfunction, normal RV,  trivial leakiness of the mitral valve, calcified aortic valve with moderate aortic valve stenosis and mildly dilated aortic root at 40 mm and ascending aorta 38 mm.  Compared to echo 1 year ago  stiffness of the heart muscle has improved and no change in degree of aortic stenosis.  Repeat 2D echo in 1 year for aortic stenosis   Patient verbalizes understanding and agrees to repeat echo in one year.

## 2024-09-03 NOTE — Telephone Encounter (Signed)
 Patient is returning call

## 2024-09-03 NOTE — Telephone Encounter (Signed)
 Call to patient to discuss carotid results, no answer. No updated DPR on file for our practice. Left VM asking recipient to call [place] at our office #.

## 2024-09-26 ENCOUNTER — Ambulatory Visit: Admitting: Cardiology

## 2024-09-26 ENCOUNTER — Encounter: Payer: Self-pay | Admitting: Cardiology

## 2024-09-26 VITALS — BP 146/80 | HR 55 | Resp 16 | Ht 71.0 in | Wt 249.1 lb

## 2024-09-26 DIAGNOSIS — I2510 Atherosclerotic heart disease of native coronary artery without angina pectoris: Secondary | ICD-10-CM

## 2024-09-26 DIAGNOSIS — I77810 Thoracic aortic ectasia: Secondary | ICD-10-CM

## 2024-09-26 DIAGNOSIS — I10 Essential (primary) hypertension: Secondary | ICD-10-CM

## 2024-09-26 DIAGNOSIS — I350 Nonrheumatic aortic (valve) stenosis: Secondary | ICD-10-CM | POA: Diagnosis not present

## 2024-09-26 DIAGNOSIS — E782 Mixed hyperlipidemia: Secondary | ICD-10-CM | POA: Diagnosis not present

## 2024-09-26 DIAGNOSIS — E1169 Type 2 diabetes mellitus with other specified complication: Secondary | ICD-10-CM

## 2024-09-26 DIAGNOSIS — I6521 Occlusion and stenosis of right carotid artery: Secondary | ICD-10-CM | POA: Diagnosis not present

## 2024-09-26 NOTE — Progress Notes (Signed)
 " Cardiology Office Note:    Date:  09/26/2024   ID:  Russell Vincent, DOB [birth year], MRN [id number]  PCP:  Trudy Elodia PARAS, PA-C  Cardiologist:  Wilbert Bihari, MD    Referring MD: Trudy Elodia PARAS, PA-C   Chief Complaint  Patient presents with   Dilated aortic root   Follow-up   Coronary Artery Disease   Hyperlipidemia   Hypertension   Aortic Stenosis    History of Present Illness:    Russell Vincent is a 74 y.o. male with a hx of HTN, type 2 DM, mixed hyperlipidemia and family history of premature CAD.    The patient is here today and is doing well.  He denies any CP or pressure, SOB, DOE, PND, orthopnea, LE edema, dizziness, palpitations or syncope.  Past Medical History:  Diagnosis Date   Aortic stenosis    Moderate by 2D echo 09/11/2024 with mean AVG 20 mmHg, AVA 1.27 cm and DI 0.37.   Ascending aorta dilatation    2D echo 08/2024 ascending aorta 38 mm and aortic root 40 mm   Benign essential HTN 11/11/2016   Carotid stenosis, asymptomatic, right    1-39% bilateral  carotid stenosis.  RIght subclavian artery stenosis by carotid dopplers 08/2024   Chronic kidney disease    Family history of premature CAD 11/11/2016   Hyperlipidemia 11/11/2016   Type 2 DM with CKD and hypertension     History reviewed. No pertinent surgical history.  Current Medications: Current Meds  Medication Sig   aspirin  EC 81 MG tablet Take 1 tablet (81 mg total) by mouth daily.   atorvastatin  (LIPITOR) 40 MG tablet Take 1 tablet (40 mg total) by mouth daily. Pt must keep appt in February 2026 with Cardiology for any more refills. Thank You   CINNAMON PO Take 2 capsules by mouth daily.   Coenzyme Q10 (CO Q 10 PO) Take 1 capsule by mouth daily.   ezetimibe  (ZETIA ) 10 MG tablet Take 1 tablet (10 mg total) by mouth daily. Pt must keep appt in February 2026 with Cardiology for any more refills. Thank You   JANUVIA 50 MG tablet Take 50 mg by mouth daily.   metFORMIN (GLUCOPHAGE-XR) 500  MG 24 hr tablet Take 500 mg by mouth 2 (two) times daily.   Multiple Vitamin (MULTIVITAMIN) tablet Take 1 tablet by mouth daily.   tamsulosin (FLOMAX) 0.4 MG CAPS capsule Take 1 capsule by mouth daily.   TURMERIC PO Take 2 tablets by mouth daily.   VASCEPA  1 g capsule TAKE 1 CAPSULE TWICE DAILY     Allergies:   Patient has no known allergies.   Social History   Socioeconomic History   Marital status: Married    Spouse name: Not on file   Number of children: Not on file   Years of education: Not on file   Highest education level: Not on file  Occupational History   Not on file  Tobacco Use   Smoking status: Never   Smokeless tobacco: Never  Vaping Use   Vaping status: Never Used  Substance and Sexual Activity   Alcohol use: Yes    Comment: 2 beers a month   Drug use: No   Sexual activity: Yes  Other Topics Concern   Not on file  Social History Narrative   Not on file   Social Drivers of Health   Tobacco Use: Low Risk (09/26/2024)   Patient History    Smoking Tobacco Use: Never  Smokeless Tobacco Use: Never    Passive Exposure: Not on file  Financial Resource Strain: Not on file  Food Insecurity: Low Risk (09/04/2024)   Received from [facility]   Epic    Within the past 12 months, you worried that your food would run out before you got money to buy more: Never true    Within the past 12 months, the food you bought just didn't last and you didn't have money to get more. : Never true  Transportation Needs: No Transportation Needs (09/04/2024)   Received from [facility]    In the past 12 months, has lack of reliable transportation kept you from medical appointments, meetings, work or from getting things needed for daily living? : No  Physical Activity: Not on file  Stress: Not on file  Social Connections: Unknown (01/05/2022)   Received from [facility]   Social Network    Social Network: Not on file  Depression (PHQ2-9): Not on file  Alcohol  Screen: Not on file  Housing: Low Risk (09/04/2024)   Received from [facility]   Epic    What is your living situation today?: I have a steady place to live    Think about the place you live. Do you have problems with any of the following? Choose all that apply:: None/None on this list  Utilities: Low Risk (09/04/2024)   Received from [facility]   Utilities    In the past 12 months has the electric, gas, oil, or water company threatened to shut off services in your home? : No  Health Literacy: Not on file     Family History: The patient's family history includes Heart disease in his father and mother.  ROS:   Please see the history of present illness.    ROS  All other systems reviewed and negative.   EKGs/Labs/Other Studies Reviewed:    The following studies were reviewed today: EKG Interpretation Date/Time:  Wednesday September 26 2024 08:25:09 EST Ventricular Rate:  56 PR Interval:  186 QRS Duration:  86 QT Interval:  390 QTC Calculation: 376 R Axis:   -3  Text Interpretation: Sinus bradycardia with sinus arrhythmia Minimal voltage criteria for LVH, may be normal variant ( R in aVL ) When compared with ECG of 08-Sep-2023 08:23, Premature atrial complexes are no longer Present Confirmed by Shlomo Corning ([id number]) on 09/26/2024 8:49:32 AM    Recent Labs: No results found for requested labs within last 365 days.   Recent Lipid Panel    Component Value Date/Time   CHOL 161 01/02/2021 1114   TRIG 211 (H) 01/02/2021 1114   HDL 48 01/02/2021 1114   CHOLHDL 3.4 01/02/2021 1114   LDLCALC 78 01/02/2021 1114    Physical Exam:    VS:  BP (!) 146/80 (BP Location: Left Arm, Patient Position: Sitting, Cuff Size: Large)   Pulse (!) 55   Resp 16   Ht 5' 11 (1.803 m)   SpO2 94%   BMI 34.87 kg/m     Wt Readings from Last 3 Encounters:  09/08/23 250 lb (113.4 kg)  04/06/22 231 lb 12.8 oz (105.1 kg)  01/02/21 252 lb (114.3 kg)    GEN: Well nourished, well developed in no  acute distress HEENT: Normal NECK: No JVD; right carotid bruit likely radiating from AV murmur LYMPHATICS: No lymphadenopathy CARDIAC:RRR, no  rubs, gallops 2/6 mid peaking SM at RUSB  RESPIRATORY:  Clear to auscultation without rales, wheezing or rhonchi  ABDOMEN: Soft, non-tender, non-distended MUSCULOSKELETAL:  No edema; No deformity  SKIN: Warm and dry NEUROLOGIC:  Alert and oriented x 3 PSYCHIATRIC:  Normal affect   ASSESSMENT:    1. Dilated aortic root   2. Benign essential HTN   3. Coronary artery calcification   4. Mixed diabetic hyperlipidemia associated with type 2 diabetes mellitus (HCC)   5. Nonrheumatic aortic valve stenosis   6. Carotid stenosis, asymptomatic, right     PLAN:    In order of problems listed above:  Dilated aortic root -Chest CTA 01/2019 showed sinus of valsalva dimensions of 4.1cm.   -echo 01/2020 reviewed and showed aortic root of 38mm -Chest MRI/MRA 02/09/2021 showed normal aortic root and ascending aortic dimensions -2D echo 09/11/2024 showed a ascending aorta borderline at 38 mmHg and aortic root 40 mm -Repeat chest CTA  HTN - BP borderline controlled on exam today at 146/68mmHg.  At home usually runs 120/60's at home. -Currently on no antihypertensive therapy  Coronary artery calcifications on chest CT -Calcium  scoring on 11/26/2016 was elevated at 244.   -Nuclear stress test 2018 showed no ischemia - He denies any anginal symptoms since last seen - Continue aspirin  81 mg daily, atorvastatin  40 mg daily, Zetia  10 mg daily and Vascepa  1 g twice daily with as needed refills  HLD -LDL goal < 70 -Continue atorvastatin  40 mg daily, Zetia  10 mg daily, Vascepa  1 g twice daily with as needed refills -I have personally reviewed and interpreted outside labs performed by patient's PCP which showed LDL 47, HDL 42, TAG 154>>starting on Jardiance so getting DM under control will help bring down TAGs  Aortic stenosis -mild by echo 2019 but none on echo  2021 - 2D echo 08/30/2024 EF 60 to 65%, mild LVH, G1 DD, trivial MR, moderate aortic valve stenosis with AVA 1.27 cm, mean aortic valve gradient 20 mmHg and V-max 3.1 M/S. - Repeat 2D echo 08/2025  Carotid artery stenosis -1-39% right carotid stenosis and right subclavian artery stenosis 08/2024 - He is not had any claudication symptoms in the right arm -Continue statin therapy -repeat carotid dopplers 08/2025    Followup with me in 1 year  Medication Adjustments/Labs and Tests Ordered: Current medicines are reviewed at length with the patient today.  Concerns regarding medicines are outlined above.  Orders Placed This Encounter  Procedures   EKG 12-Lead   No orders of the defined types were placed in this encounter.   Signed, Wilbert Bihari, MD  09/26/2024 8:28 AM    [facility] "

## 2024-09-26 NOTE — Addendum Note (Signed)
 Addended by: JANIT FRIEZE L on: 09/26/2024 09:00 AM   Modules accepted: Orders

## 2024-09-26 NOTE — Patient Instructions (Signed)
 Medication Instructions:  Your physician recommends that you continue on your current medications as directed. Please refer to the Current Medication list given to you today.  *If you need a refill on your cardiac medications before your next appointment, please call your pharmacy*  Lab Work: None.  If you have labs (blood work) drawn today and your tests are completely normal, you will receive your results only by: MyChart Message (if you have MyChart) OR A paper copy in the mail If you have any lab test that is abnormal or we need to change your treatment, we will call you to review the results.  Testing/Procedures: Your physician has requested that you have an echocardiogram in January 2027. Echocardiography is a painless test that uses sound waves to create images of your heart. It provides your doctor with information about the size and shape of your heart and how well your hearts chambers and valves are working. This procedure takes approximately one hour. There are no restrictions for this procedure. Please do NOT wear cologne, perfume, aftershave, or lotions (deodorant is allowed). Please arrive 15 minutes prior to your appointment time.   Your physician has requested that you have a carotid duplex in January 2027. This test is an ultrasound of the carotid arteries in your neck. It looks at blood flow through these arteries that supply the brain with blood. Allow one hour for this exam. There are no restrictions or special instructions.    Your cardiac CT will be scheduled at:    [facility] [address]  [place], [place] [address]   If scheduled at the [facility] at [address], please enter the parking lot using the [address] entrance and use the FREE valet service at the patient drop-off area. Enter the building and check-in with registration on the main floor.   Please follow these instructions carefully (unless  otherwise directed):  An IV will be required for this test.  On the Night Before the Test: Be sure to Drink plenty of water.  On the Day of the Test: Drink plenty of water until 1 hour prior to the test. Do not eat any food 1 hour prior to test. You may take your regular medications prior to the test.  If you take Furosemide/Hydrochlorothiazide/Spironolactone/Chlorthalidone, please HOLD on the morning of the test. Patients who wear a continuous glucose monitor MUST remove the device prior to scanning.      After the Test: Drink plenty of water. After receiving IV contrast, you may experience a mild flushed feeling. This is normal. On occasion, you may experience a mild rash up to 24 hours after the test. This is not dangerous. If this occurs, you can take Benadryl 25 mg, Zyrtec, Claritin, or Allegra and increase your fluid intake. (Patients taking Tikosyn should avoid Benadryl, and may take Zyrtec, Claritin, or Allegra) If you experience trouble breathing, this can be serious. If it is severe call 911 IMMEDIATELY. If it is mild, please call our office.  We will call to schedule your test 2-4 weeks out understanding that some insurance companies will need an authorization prior to the service being performed.   For more information and frequently asked questions, please visit our website : [email or web address]  For non-scheduling related questions, please contact the cardiac imaging nurse navigator should you have any questions/concerns: Cardiac Imaging Nurse Navigators Direct Office Dial: [phone]   For scheduling needs, including cancellations and rescheduling, please call [place], [phone].  For  billing questions, please call [phone].      Follow-Up: At [facility], you and your health needs are our priority.  As part of our continuing mission to provide you with exceptional heart care, our providers are all part of one team.  This team  includes your primary Cardiologist (physician) and Advanced Practice Providers or APPs (Physician Assistants and Nurse Practitioners) who all work together to provide you with the care you need, when you need it.  Your next appointment:   1 year(s)  Provider:   Wilbert Bihari, MD

## 2024-09-28 ENCOUNTER — Other Ambulatory Visit: Payer: Self-pay

## 2024-09-28 DIAGNOSIS — I6521 Occlusion and stenosis of right carotid artery: Secondary | ICD-10-CM

## 2024-09-28 NOTE — Progress Notes (Signed)
 Repeat carotid already scheduled for 2027.

## 2024-09-28 NOTE — Telephone Encounter (Signed)
 Call to patient to discuss doppler results, no answer. No updated DPR on file for our practice. Left VM asking recipient to call [place] at our office #. Also send MC. Second and third attempt. Letter sent.

## 2024-09-28 NOTE — Telephone Encounter (Signed)
-----   Message from Wilbert Bihari, MD sent at 09/02/2024  7:23 PM EST ----- Carotid dopplers showed 1-39% bilateral stenosis - possible flow disturbance in the right subclavian without stenosis noted. Repeat dopplers in 1 year

## 2024-10-05 ENCOUNTER — Ambulatory Visit (HOSPITAL_COMMUNITY)

## 2025-09-16 ENCOUNTER — Ambulatory Visit (HOSPITAL_COMMUNITY)
